# Patient Record
Sex: Female | Born: 1955
Health system: Southern US, Community
[De-identification: ages and names within clinical notes are randomized; demographics above are authoritative.]

## PROBLEM LIST (undated history)

## (undated) DIAGNOSIS — M1990 Unspecified osteoarthritis, unspecified site: Secondary | ICD-10-CM

## (undated) DIAGNOSIS — Z862 Personal history of diseases of the blood and blood-forming organs and certain disorders involving the immune mechanism: Secondary | ICD-10-CM

## (undated) DIAGNOSIS — Z9889 Other specified postprocedural states: Secondary | ICD-10-CM

## (undated) DIAGNOSIS — B019 Varicella without complication: Secondary | ICD-10-CM

## (undated) DIAGNOSIS — R112 Nausea with vomiting, unspecified: Secondary | ICD-10-CM

## (undated) DIAGNOSIS — Z9189 Other specified personal risk factors, not elsewhere classified: Secondary | ICD-10-CM

## (undated) DIAGNOSIS — C4491 Basal cell carcinoma of skin, unspecified: Secondary | ICD-10-CM

## (undated) HISTORY — DX: Other specified personal risk factors, not elsewhere classified: Z91.89

## (undated) HISTORY — PX: BREAST BIOPSY: SHX20

## (undated) HISTORY — DX: Varicella without complication: B01.9

## (undated) HISTORY — PX: THYROIDECTOMY: SHX17

## (undated) HISTORY — DX: Basal cell carcinoma of skin, unspecified: C44.91

---

## 2012-04-06 HISTORY — PX: COLONOSCOPY: SHX174

## 2014-12-20 ENCOUNTER — Encounter (INDEPENDENT_AMBULATORY_CARE_PROVIDER_SITE_OTHER): Payer: Self-pay

## 2014-12-20 ENCOUNTER — Encounter: Payer: Self-pay | Admitting: Family Medicine

## 2014-12-20 ENCOUNTER — Ambulatory Visit (INDEPENDENT_AMBULATORY_CARE_PROVIDER_SITE_OTHER): Payer: BLUE CROSS/BLUE SHIELD | Admitting: Family Medicine

## 2014-12-20 ENCOUNTER — Encounter: Payer: Self-pay | Admitting: *Deleted

## 2014-12-20 ENCOUNTER — Other Ambulatory Visit (HOSPITAL_COMMUNITY)
Admission: RE | Admit: 2014-12-20 | Discharge: 2014-12-20 | Disposition: A | Discharge status: Home / Self Care | Payer: BLUE CROSS/BLUE SHIELD | Source: Ambulatory Visit | Attending: Family Medicine | Admitting: Family Medicine

## 2014-12-20 VITALS — BP 112/72 | HR 56 | Temp 97.8°F | Ht 64.0 in | Wt 141.0 lb

## 2014-12-20 DIAGNOSIS — Z01419 Encounter for gynecological examination (general) (routine) without abnormal findings: Secondary | ICD-10-CM | POA: Diagnosis present

## 2014-12-20 DIAGNOSIS — Z0000 Encounter for general adult medical examination without abnormal findings: Secondary | ICD-10-CM | POA: Diagnosis not present

## 2014-12-20 DIAGNOSIS — Z1239 Encounter for other screening for malignant neoplasm of breast: Secondary | ICD-10-CM

## 2014-12-20 DIAGNOSIS — Z1151 Encounter for screening for human papillomavirus (HPV): Secondary | ICD-10-CM | POA: Insufficient documentation

## 2014-12-20 LAB — CBC WITH DIFFERENTIAL/PLATELET
BASOS PCT: 1.1 % (ref 0.0–3.0)
Basophils Absolute: 0 10*3/uL (ref 0.0–0.1)
EOS PCT: 2.4 % (ref 0.0–5.0)
Eosinophils Absolute: 0.1 10*3/uL (ref 0.0–0.7)
HCT: 40.9 % (ref 36.0–46.0)
Hemoglobin: 13.7 g/dL (ref 12.0–15.0)
LYMPHS ABS: 1.4 10*3/uL (ref 0.7–4.0)
Lymphocytes Relative: 32.9 % (ref 12.0–46.0)
MCHC: 33.4 g/dL (ref 30.0–36.0)
MCV: 93.5 fl (ref 78.0–100.0)
MONO ABS: 0.3 10*3/uL (ref 0.1–1.0)
Monocytes Relative: 6.2 % (ref 3.0–12.0)
NEUTROS ABS: 2.5 10*3/uL (ref 1.4–7.7)
NEUTROS PCT: 57.4 % (ref 43.0–77.0)
Platelets: 225 10*3/uL (ref 150.0–400.0)
RBC: 4.37 Mil/uL (ref 3.87–5.11)
RDW: 13 % (ref 11.5–15.5)
WBC: 4.4 10*3/uL (ref 4.0–10.5)

## 2014-12-20 LAB — LIPID PANEL
CHOLESTEROL: 177 mg/dL (ref 0–200)
HDL: 72.2 mg/dL (ref >39.00)
LDL CALC: 81 mg/dL (ref 0–99)
NonHDL: 104.35
Total CHOL/HDL Ratio: 2
Triglycerides: 115 mg/dL (ref 0.0–149.0)
VLDL: 23 mg/dL (ref 0.0–40.0)

## 2014-12-20 LAB — COMPREHENSIVE METABOLIC PANEL
ALT: 26 U/L (ref 0–35)
AST: 22 U/L (ref 0–37)
Albumin: 4.1 g/dL (ref 3.5–5.2)
Alkaline Phosphatase: 88 U/L (ref 39–117)
BUN: 13 mg/dL (ref 6–23)
CHLORIDE: 105 meq/L (ref 96–112)
CO2: 31 mEq/L (ref 19–32)
Calcium: 9.5 mg/dL (ref 8.4–10.5)
Creatinine, Ser: 0.64 mg/dL (ref 0.40–1.20)
GFR: 101.44 mL/min (ref >60.00)
GLUCOSE: 85 mg/dL (ref 70–99)
POTASSIUM: 3.9 meq/L (ref 3.5–5.1)
SODIUM: 140 meq/L (ref 135–145)
Total Bilirubin: 0.4 mg/dL (ref 0.2–1.2)
Total Protein: 6.6 g/dL (ref 6.0–8.3)

## 2014-12-20 LAB — TSH: TSH: 0.73 u[IU]/mL (ref 0.35–4.50)

## 2014-12-20 LAB — VITAMIN B12: Vitamin B-12: 662 pg/mL (ref 211–911)

## 2014-12-20 LAB — VITAMIN D 25 HYDROXY (VIT D DEFICIENCY, FRACTURES): VITD: 31.72 ng/mL (ref 30.00–100.00)

## 2014-12-20 NOTE — Progress Notes (Signed)
Pre visit review using our clinic review tool, if applicable. No additional management support is needed unless otherwise documented below in the visit note. 

## 2014-12-20 NOTE — Assessment & Plan Note (Signed)
Reviewed preventive care protocols, scheduled due services, and updated immunizations Discussed nutrition, exercise, diet, and healthy lifestyle.  Orders Placed This Encounter  Procedures  . MM Digital Screening  . CBC with Differential/Platelet  . Comprehensive metabolic panel  . Lipid panel  . TSH  . Vitamin D, 25-hydroxy  . Vitamin B12   Pap smear today. Mammogram ordered- pt to call to set up.

## 2014-12-20 NOTE — Progress Notes (Signed)
Subjective:   Patient ID: Alicia Valencia, female    DOB: 07-Dec-1955, 59 y.o.   MRN: [id number]  Alicia Valencia is a pleasant 59 y.o. year old female who presents to clinic today with [place]  on 12/20/2014  HPI: New patient, moved here from [place] to be closer to family.  Last pap smear 09/2008- no history of abnormal pap smear or post menopausal bleeding. No family history of breast, uterine cancer.  Colonoscopy 04/2012.  No current outpatient prescriptions on file prior to visit.   No current facility-administered medications on file prior to visit.    No Known Allergies  Past Medical History  Diagnosis Date  . History of fainting spells of unknown cause     Past Surgical History  Procedure Laterality Date  . Thyroidectomy    . Cesarean section      Family History  Problem Relation Age of Onset  . Heart disease Mother   . Hypertension Mother   . Diabetes Mother   . Diabetes Father   . Hypertension Sister   . Diabetes Maternal Aunt   . Diabetes Maternal Uncle   . Diabetes Maternal Grandmother   . Diabetes Maternal Grandfather     Social History   Social History  . Marital Status: Married    Spouse Name: N/A  . Number of Children: N/A  . Years of Education: N/A   Occupational History  . Not on file.   Social History Main Topics  . Smoking status: Former Research scientist (life sciences)  . Smokeless tobacco: Never Used  . Alcohol Use: Yes  . Drug Use: No  . Sexual Activity: Yes   Other Topics Concern  . Not on file   Social History Narrative  . No narrative on file   The PMH, PSH, Social History, Family History, Medications, and allergies have been reviewed in [facility], and have been updated if relevant.   Review of Systems  Constitutional: Negative.   HENT: Negative.   Eyes: Negative.   Respiratory: Negative.   Cardiovascular: Negative.   Gastrointestinal: Negative.   Endocrine: Negative.   Genitourinary: Negative.   Musculoskeletal: Negative.   Skin:  Negative.   Allergic/Immunologic: Negative.   Neurological: Negative.   Hematological: Negative.   Psychiatric/Behavioral: Negative.   All other systems reviewed and are negative.      Objective:    BP 112/72 mmHg  Pulse 56  Temp(Src) 97.8 F (36.6 C) (Oral)  Ht 5\' 4"  (1.626 m)  Wt 141 lb (63.957 kg)  BMI 24.19 kg/m2  SpO2 98%   Physical Exam    General:  Well-developed,well-nourished,in no acute distress; alert,appropriate and cooperative throughout examination Head:  normocephalic and atraumatic.   Eyes:  vision grossly intact, pupils equal, pupils round, and pupils reactive to light.   Ears:  R ear normal and L ear normal.   Nose:  no external deformity.   Mouth:  good dentition.   Neck:  No deformities, masses, or tenderness noted. Breasts:  No mass, nodules, thickening, tenderness, bulging, retraction, inflamation, nipple discharge or skin changes noted.   Lungs:  Normal respiratory effort, chest expands symmetrically. Lungs are clear to auscultation, no crackles or wheezes. Heart:  Normal rate and regular rhythm. S1 and S2 normal without gallop, murmur, click, rub or other extra sounds. Abdomen:  Bowel sounds positive,abdomen soft and non-tender without masses, organomegaly or hernias noted. Rectal:  no external abnormalities.   Genitalia:  Pelvic Exam:        External: normal female genitalia without  lesions or masses        Vagina: normal without lesions or masses        Cervix: normal without lesions or masses        Adnexa: normal bimanual exam without masses or fullness        Uterus: normal by palpation        Pap smear: performed Msk:  No deformity or scoliosis noted of thoracic or lumbar spine.   Extremities:  No clubbing, cyanosis, edema, or deformity noted with normal full range of motion of all joints.   Neurologic:  alert & oriented X3 and gait normal.   Skin:  Intact without suspicious lesions or rashes Cervical Nodes:  No lymphadenopathy  noted Axillary Nodes:  No palpable lymphadenopathy Psych:  Cognition and judgment appear intact. Alert and cooperative with normal attention span and concentration. No apparent delusions, illusions, hallucinations      Assessment & Plan:   Screening for breast cancer - Plan: MM Digital Screening, CBC with Differential/Platelet, Comprehensive metabolic panel, Lipid panel, TSH, Vitamin D, 25-hydroxy, Vitamin B12  Well woman exam No Follow-up on file.

## 2014-12-20 NOTE — Addendum Note (Signed)
Addended by: Tammi Sou on: 12/20/2014 11:34 AM   Modules accepted: Orders

## 2014-12-20 NOTE — Patient Instructions (Signed)
It was very nice to meet you. We will call you with your results and you can view them online.

## 2014-12-21 LAB — CYTOLOGY - PAP

## 2014-12-25 ENCOUNTER — Ambulatory Visit
Admission: RE | Admit: 2014-12-25 | Discharge: 2014-12-25 | Disposition: A | Discharge status: Home / Self Care | Payer: BLUE CROSS/BLUE SHIELD | Source: Ambulatory Visit | Attending: Family Medicine | Admitting: Family Medicine

## 2014-12-25 ENCOUNTER — Encounter: Payer: Self-pay | Admitting: *Deleted

## 2014-12-25 DIAGNOSIS — R922 Inconclusive mammogram: Secondary | ICD-10-CM | POA: Diagnosis not present

## 2014-12-25 DIAGNOSIS — Z1231 Encounter for screening mammogram for malignant neoplasm of breast: Secondary | ICD-10-CM | POA: Insufficient documentation

## 2014-12-25 DIAGNOSIS — Z1239 Encounter for other screening for malignant neoplasm of breast: Secondary | ICD-10-CM

## 2015-01-08 ENCOUNTER — Encounter: Payer: BLUE CROSS/BLUE SHIELD | Admitting: Family Medicine

## 2015-01-10 ENCOUNTER — Other Ambulatory Visit: Payer: Self-pay | Admitting: Family Medicine

## 2015-01-10 DIAGNOSIS — R928 Other abnormal and inconclusive findings on diagnostic imaging of breast: Secondary | ICD-10-CM

## 2015-01-23 ENCOUNTER — Ambulatory Visit
Admission: RE | Admit: 2015-01-23 | Discharge: 2015-01-23 | Disposition: A | Discharge status: Home / Self Care | Payer: BLUE CROSS/BLUE SHIELD | Source: Ambulatory Visit | Attending: Family Medicine | Admitting: Family Medicine

## 2015-01-23 DIAGNOSIS — R928 Other abnormal and inconclusive findings on diagnostic imaging of breast: Secondary | ICD-10-CM

## 2015-12-23 ENCOUNTER — Other Ambulatory Visit: Payer: Self-pay | Admitting: Family Medicine

## 2015-12-23 ENCOUNTER — Ambulatory Visit (INDEPENDENT_AMBULATORY_CARE_PROVIDER_SITE_OTHER): Payer: BLUE CROSS/BLUE SHIELD | Admitting: Family Medicine

## 2015-12-23 ENCOUNTER — Encounter: Payer: Self-pay | Admitting: Family Medicine

## 2015-12-23 VITALS — BP 112/60 | HR 52 | Temp 97.9°F | Ht 64.0 in | Wt 138.0 lb

## 2015-12-23 DIAGNOSIS — Z1231 Encounter for screening mammogram for malignant neoplasm of breast: Secondary | ICD-10-CM

## 2015-12-23 DIAGNOSIS — Z01419 Encounter for gynecological examination (general) (routine) without abnormal findings: Secondary | ICD-10-CM

## 2015-12-23 DIAGNOSIS — Z0000 Encounter for general adult medical examination without abnormal findings: Secondary | ICD-10-CM | POA: Diagnosis not present

## 2015-12-23 LAB — CBC WITH DIFFERENTIAL/PLATELET
BASOS PCT: 0.9 % (ref 0.0–3.0)
Basophils Absolute: 0 10*3/uL (ref 0.0–0.1)
EOS PCT: 2.9 % (ref 0.0–5.0)
Eosinophils Absolute: 0.1 10*3/uL (ref 0.0–0.7)
HCT: 41.8 % (ref 36.0–46.0)
HEMOGLOBIN: 14.1 g/dL (ref 12.0–15.0)
LYMPHS ABS: 1.6 10*3/uL (ref 0.7–4.0)
Lymphocytes Relative: 40 % (ref 12.0–46.0)
MCHC: 33.9 g/dL (ref 30.0–36.0)
MCV: 92.5 fl (ref 78.0–100.0)
MONO ABS: 0.4 10*3/uL (ref 0.1–1.0)
Monocytes Relative: 9.3 % (ref 3.0–12.0)
NEUTROS ABS: 1.9 10*3/uL (ref 1.4–7.7)
Neutrophils Relative %: 46.9 % (ref 43.0–77.0)
PLATELETS: 242 10*3/uL (ref 150.0–400.0)
RBC: 4.52 Mil/uL (ref 3.87–5.11)
RDW: 13 % (ref 11.5–15.5)
WBC: 4.1 10*3/uL (ref 4.0–10.5)

## 2015-12-23 LAB — LIPID PANEL
CHOL/HDL RATIO: 2
Cholesterol: 196 mg/dL (ref 0–200)
HDL: 79.1 mg/dL (ref >39.00)
LDL Cholesterol: 102 mg/dL — ABNORMAL HIGH (ref 0–99)
NONHDL: 116.66
TRIGLYCERIDES: 71 mg/dL (ref 0.0–149.0)
VLDL: 14.2 mg/dL (ref 0.0–40.0)

## 2015-12-23 LAB — COMPREHENSIVE METABOLIC PANEL
ALK PHOS: 77 U/L (ref 39–117)
ALT: 20 U/L (ref 0–35)
AST: 19 U/L (ref 0–37)
Albumin: 4.3 g/dL (ref 3.5–5.2)
BILIRUBIN TOTAL: 0.7 mg/dL (ref 0.2–1.2)
BUN: 13 mg/dL (ref 6–23)
CALCIUM: 9.3 mg/dL (ref 8.4–10.5)
CO2: 30 meq/L (ref 19–32)
CREATININE: 0.83 mg/dL (ref 0.40–1.20)
Chloride: 104 mEq/L (ref 96–112)
GFR: 74.88 mL/min (ref >60.00)
GLUCOSE: 96 mg/dL (ref 70–99)
Potassium: 3.8 mEq/L (ref 3.5–5.1)
SODIUM: 140 meq/L (ref 135–145)
TOTAL PROTEIN: 6.8 g/dL (ref 6.0–8.3)

## 2015-12-23 LAB — TSH: TSH: 1.48 u[IU]/mL (ref 0.35–4.50)

## 2015-12-23 NOTE — Progress Notes (Signed)
Pre visit review using our clinic review tool, if applicable. No additional management support is needed unless otherwise documented below in the visit note. 

## 2015-12-23 NOTE — Assessment & Plan Note (Signed)
Reviewed preventive care protocols, scheduled due services, and updated immunizations Discussed nutrition, exercise, diet, and healthy lifestyle.  Declines influenza vaccine.  Orders Placed This Encounter  Procedures  . CBC with Differential/Platelet  . Comprehensive metabolic panel  . Lipid panel  . TSH

## 2015-12-23 NOTE — Patient Instructions (Signed)
Great to see you. We will call you with your lab results and you can them online.

## 2015-12-23 NOTE — Progress Notes (Signed)
Subjective:   Patient ID: Sanvi Oguinn, female    DOB: 13-Feb-1956, 60 y.o.   MRN: [id number]  Chani Karnatz is a pleasant 60 y.o. year old female who presents to clinic today with Annual Exam  on 12/23/2015  HPI: Established care with me last year.   Last pap smear 12/2014- no history of abnormal pap smear or post menopausal bleeding. No family history of breast, uterine cancer.  Colonoscopy 04/2012.  Mammogram 01/23/15  Lab Results  Component Value Date   CHOL 177 12/20/2014   HDL 72.20 12/20/2014   LDLCALC 81 12/20/2014   TRIG 115.0 12/20/2014   CHOLHDL 2 12/20/2014   Lab Results  Component Value Date   CREATININE 0.64 12/20/2014   Lab Results  Component Value Date   TSH 0.73 12/20/2014   Lab Results  Component Value Date   WBC 4.4 12/20/2014   HGB 13.7 12/20/2014   HCT 40.9 12/20/2014   MCV 93.5 12/20/2014   PLT 225.0 12/20/2014   Lab Results  Component Value Date   NA 140 12/20/2014   K 3.9 12/20/2014   CL 105 12/20/2014   CO2 31 12/20/2014   Lab Results  Component Value Date   ALT 26 12/20/2014   AST 22 12/20/2014   ALKPHOS 88 12/20/2014   BILITOT 0.4 12/20/2014     No current outpatient prescriptions on file prior to visit.   No current facility-administered medications on file prior to visit.     No Known Allergies  Past Medical History:  Diagnosis Date  . History of fainting spells of unknown cause     Past Surgical History:  Procedure Laterality Date  . BREAST BIOPSY Left    negative  . CESAREAN SECTION    . THYROIDECTOMY      Family History  Problem Relation Age of Onset  . Heart disease Mother   . Hypertension Mother   . Diabetes Mother   . Diabetes Father   . Hypertension Sister   . Diabetes Maternal Aunt   . Diabetes Maternal Uncle   . Diabetes Maternal Grandmother   . Diabetes Maternal Grandfather     Social History   Social History  . Marital status: Married    Spouse name: N/A  . Number of children: N/A    . Years of education: N/A   Occupational History  . Not on file.   Social History Main Topics  . Smoking status: Former Research scientist (life sciences)  . Smokeless tobacco: Never Used  . Alcohol use Yes  . Drug use: No  . Sexual activity: Yes   Other Topics Concern  . Not on file   Social History Narrative  . No narrative on file   The PMH, PSH, Social History, Family History, Medications, and allergies have been reviewed in [facility], and have been updated if relevant.   Review of Systems  Constitutional: Negative.   HENT: Negative.   Eyes: Negative.   Respiratory: Negative.   Cardiovascular: Negative.   Gastrointestinal: Negative.   Endocrine: Negative.   Genitourinary: Negative.   Musculoskeletal: Negative.   Skin: Negative.   Allergic/Immunologic: Negative.   Neurological: Negative.   Hematological: Negative.   Psychiatric/Behavioral: Negative.   All other systems reviewed and are negative.      Objective:    BP 112/60   Pulse (!) 52   Temp 97.9 F (36.6 C) (Oral)   Ht 5\' 4"  (1.626 m)   Wt 138 lb (62.6 kg)   SpO2 99%   BMI 23.69  kg/m    Physical Exam    General:  Well-developed,well-nourished,in no acute distress; alert,appropriate and cooperative throughout examination Head:  normocephalic and atraumatic.   Eyes:  vision grossly intact, pupils equal, pupils round, and pupils reactive to light.   Ears:  R ear normal and L ear normal.   Nose:  no external deformity.   Mouth:  good dentition.   Neck:  No deformities, masses, or tenderness noted. Breasts:  No mass, nodules, thickening, tenderness, bulging, retraction, inflamation, nipple discharge or skin changes noted.   Lungs:  Normal respiratory effort, chest expands symmetrically. Lungs are clear to auscultation, no crackles or wheezes. Heart:  Normal rate and regular rhythm. S1 and S2 normal without gallop, murmur, click, rub or other extra sounds. Abdomen:  Bowel sounds positive,abdomen soft and non-tender without  masses, organomegaly or hernias noted. Msk:  No deformity or scoliosis noted of thoracic or lumbar spine.   Extremities:  No clubbing, cyanosis, edema, or deformity noted with normal full range of motion of all joints.   Neurologic:  alert & oriented X3 and gait normal.   Skin:  Intact without suspicious lesions or rashes Cervical Nodes:  No lymphadenopathy noted Axillary Nodes:  No palpable lymphadenopathy Psych:  Cognition and judgment appear intact. Alert and cooperative with normal attention span and concentration. No apparent delusions, illusions, hallucinations      Assessment & Plan:   Well woman exam - Plan: CBC with Differential/Platelet, Comprehensive metabolic panel, Lipid panel, TSH No Follow-up on file.

## 2015-12-24 ENCOUNTER — Encounter: Payer: Self-pay | Admitting: Family Medicine

## 2016-01-08 ENCOUNTER — Other Ambulatory Visit: Payer: Self-pay | Admitting: Family Medicine

## 2016-01-08 ENCOUNTER — Ambulatory Visit
Admission: RE | Admit: 2016-01-08 | Discharge: 2016-01-08 | Disposition: A | Discharge status: Home / Self Care | Payer: BLUE CROSS/BLUE SHIELD | Source: Ambulatory Visit | Attending: Family Medicine | Admitting: Family Medicine

## 2016-01-08 DIAGNOSIS — Z1231 Encounter for screening mammogram for malignant neoplasm of breast: Secondary | ICD-10-CM | POA: Insufficient documentation

## 2016-10-26 ENCOUNTER — Telehealth: Payer: Self-pay | Admitting: Family Medicine

## 2016-10-26 DIAGNOSIS — Z780 Asymptomatic menopausal state: Secondary | ICD-10-CM

## 2016-10-26 NOTE — Telephone Encounter (Signed)
Pt is calling abut a bone density test.  She would like to have it done before her 09/19 cpe with you.  Her last test was 5 years ago, done in [place].  cb number is [phone]

## 2016-10-26 NOTE — Telephone Encounter (Signed)
Order entered

## 2016-10-27 NOTE — Telephone Encounter (Signed)
Appt made and patient aware.  

## 2016-10-29 ENCOUNTER — Ambulatory Visit
Admission: RE | Admit: 2016-10-29 | Discharge: 2016-10-29 | Disposition: A | Discharge status: Home / Self Care | Payer: BLUE CROSS/BLUE SHIELD | Source: Ambulatory Visit | Attending: Family Medicine | Admitting: Family Medicine

## 2016-10-29 DIAGNOSIS — M8588 Other specified disorders of bone density and structure, other site: Secondary | ICD-10-CM | POA: Diagnosis not present

## 2016-10-29 DIAGNOSIS — Z780 Asymptomatic menopausal state: Secondary | ICD-10-CM

## 2016-11-12 ENCOUNTER — Other Ambulatory Visit: Payer: BLUE CROSS/BLUE SHIELD

## 2016-12-11 ENCOUNTER — Other Ambulatory Visit: Payer: Self-pay | Admitting: Family Medicine

## 2016-12-11 DIAGNOSIS — Z01419 Encounter for gynecological examination (general) (routine) without abnormal findings: Secondary | ICD-10-CM

## 2016-12-16 ENCOUNTER — Other Ambulatory Visit (INDEPENDENT_AMBULATORY_CARE_PROVIDER_SITE_OTHER): Payer: BLUE CROSS/BLUE SHIELD

## 2016-12-16 DIAGNOSIS — Z01419 Encounter for gynecological examination (general) (routine) without abnormal findings: Secondary | ICD-10-CM | POA: Diagnosis not present

## 2016-12-16 LAB — LIPID PANEL
CHOLESTEROL: 197 mg/dL (ref 0–200)
HDL: 96.6 mg/dL (ref >39.00)
LDL Cholesterol: 86 mg/dL (ref 0–99)
NonHDL: 100.59
TRIGLYCERIDES: 74 mg/dL (ref 0.0–149.0)
Total CHOL/HDL Ratio: 2
VLDL: 14.8 mg/dL (ref 0.0–40.0)

## 2016-12-16 LAB — CBC WITH DIFFERENTIAL/PLATELET
BASOS PCT: 1.4 % (ref 0.0–3.0)
Basophils Absolute: 0 10*3/uL (ref 0.0–0.1)
EOS PCT: 3.7 % (ref 0.0–5.0)
Eosinophils Absolute: 0.1 10*3/uL (ref 0.0–0.7)
HCT: 43.8 % (ref 36.0–46.0)
HEMOGLOBIN: 14.8 g/dL (ref 12.0–15.0)
Lymphocytes Relative: 40.4 % (ref 12.0–46.0)
Lymphs Abs: 1.4 10*3/uL (ref 0.7–4.0)
MCHC: 33.9 g/dL (ref 30.0–36.0)
MCV: 94.1 fl (ref 78.0–100.0)
MONOS PCT: 9 % (ref 3.0–12.0)
Monocytes Absolute: 0.3 10*3/uL (ref 0.1–1.0)
Neutro Abs: 1.6 10*3/uL (ref 1.4–7.7)
Neutrophils Relative %: 45.5 % (ref 43.0–77.0)
Platelets: 245 10*3/uL (ref 150.0–400.0)
RBC: 4.66 Mil/uL (ref 3.87–5.11)
RDW: 12.9 % (ref 11.5–15.5)
WBC: 3.4 10*3/uL — AB (ref 4.0–10.5)

## 2016-12-16 LAB — COMPREHENSIVE METABOLIC PANEL
ALBUMIN: 4.6 g/dL (ref 3.5–5.2)
ALK PHOS: 76 U/L (ref 39–117)
ALT: 22 U/L (ref 0–35)
AST: 19 U/L (ref 0–37)
BUN: 18 mg/dL (ref 6–23)
CALCIUM: 10 mg/dL (ref 8.4–10.5)
CHLORIDE: 103 meq/L (ref 96–112)
CO2: 28 mEq/L (ref 19–32)
Creatinine, Ser: 0.82 mg/dL (ref 0.40–1.20)
GFR: 75.68 mL/min (ref >60.00)
Glucose, Bld: 92 mg/dL (ref 70–99)
POTASSIUM: 3.9 meq/L (ref 3.5–5.1)
Sodium: 140 mEq/L (ref 135–145)
TOTAL PROTEIN: 6.9 g/dL (ref 6.0–8.3)
Total Bilirubin: 0.6 mg/dL (ref 0.2–1.2)

## 2016-12-16 LAB — TSH: TSH: 1.19 u[IU]/mL (ref 0.35–4.50)

## 2016-12-18 ENCOUNTER — Other Ambulatory Visit: Payer: BLUE CROSS/BLUE SHIELD

## 2016-12-23 ENCOUNTER — Encounter: Payer: Self-pay | Admitting: Family Medicine

## 2016-12-23 ENCOUNTER — Ambulatory Visit (INDEPENDENT_AMBULATORY_CARE_PROVIDER_SITE_OTHER): Payer: BLUE CROSS/BLUE SHIELD | Admitting: Family Medicine

## 2016-12-23 VITALS — BP 118/82 | HR 61 | Temp 97.7°F | Ht 64.0 in | Wt 141.8 lb

## 2016-12-23 DIAGNOSIS — Z01419 Encounter for gynecological examination (general) (routine) without abnormal findings: Secondary | ICD-10-CM

## 2016-12-23 DIAGNOSIS — Z23 Encounter for immunization: Secondary | ICD-10-CM

## 2016-12-23 DIAGNOSIS — M810 Age-related osteoporosis without current pathological fracture: Secondary | ICD-10-CM | POA: Insufficient documentation

## 2016-12-23 DIAGNOSIS — M8580 Other specified disorders of bone density and structure, unspecified site: Secondary | ICD-10-CM

## 2016-12-23 NOTE — Assessment & Plan Note (Signed)
Discussed increased dietary intake and continued weight bearing exercises.

## 2016-12-23 NOTE — Patient Instructions (Signed)
Calcium supplements have received some bad press lately, with questions that they may increase risk of heart attack or blood clots.  The risk is very low, however none of these risks occur with calcium in FOOD. Try to get most or all of your calcium from your food--aim for 1000 mg/day for women up to 38 and men up to 70 and 1200 mg/day for women over 56 and men over 70.  To figure out dietary calcium: 300 mg/day from all non dairy foods plus 300 mg per cup of milk, other dairy, or fortified juice.  Non dairy foods that contain calcium:  Kale, oranges, sardines, oatmeal, soy milk/soybeans, salmon, white beans, dried figs, turnip greens, almonds, broccoli, tofu.

## 2016-12-23 NOTE — Addendum Note (Signed)
Addended by: Benson Setting L on: 12/23/2016 11:50 AM   Modules accepted: Orders

## 2016-12-23 NOTE — Progress Notes (Signed)
Subjective:   Patient ID: Alicia Valencia, female    DOB: February 24, 1956, 61 y.o.   MRN: [id number]  Alicia Valencia is a pleasant 61 y.o. year old female who presents to clinic today with Annual Exam (Pt wants to discuss her bone density )  on 12/23/2016  HPI:  Last pap smear 12/2014- no history of abnormal pap smear or post menopausal bleeding. No family history of breast, uterine cancer.  Colonoscopy 04/2012.  Mammogram 10/31/16  Osteopenia-  Your patient Alicia Valencia completed a BMD test on 10/29/2016 using the [place] (analysis version: 14.10) manufactured by [employer]. The following summarizes the results of our evaluation.  PATIENT BIOGRAPHICAL: Name: Alicia Valencia, Alicia Valencia Patient ID: [id number] Birth Date: 1956-03-06 Height: 64.0 in. Gender: Female Exam Date: 10/29/2016 Weight: 138.0 lbs. Indications: Caucasian, Postmenopausal Fractures: Treatments: Calcium, Multi-Vitamin with calcium  ASSESSMENT:  The BMD measured at AP Spine L1-L4 is 0.938 g/cm2 with a T-score of -2.1. This patient is considered osteopenic according to [facility]) criteria.  Site Region Measured Measured WHO Young Adult BMD Date       Age      Classification T-score AP Spine L1-L4 10/29/2016 61 Osteopenia -2.1 0.938 g/cm2  DualFemur Neck Left 10/29/2016 61 Osteopenia -1.6 0.817 g/cm2  World Health Organization [facility]) criteria for post-menopausal, Caucasian Women: Normal:       T-score at or above -1 SD Osteopenia:   T-score between -1 and -2.5 SD Osteoporosis: T-score at or below -2.5 SD  RECOMMENDATIONS: [place] recommends that FDA-approved medical therapies be considered in postmenopausal women and men age 100 or older with a: 1. Hip or vertebral (clinical or morphometric) fracture. 2. T-score of < -2.5 at the spine or hip. 3. Ten-year fracture probability by FRAX of 3% or greater for hip fracture or 20% or greater for major  osteoporotic fracture.  All treatment decisions require clinical judgment and consideration of individual patient factors, including patient preferences, co-morbidities, previous drug use, risk factors not captured in the FRAX model (e.g. falls, vitamin D deficiency, increased bone turnover, interval significant decline in bone density) and possible under - or over-estimation of fracture risk by FRAX.  All patients should ensure an adequate intake of dietary calcium (1200 mg/d) and vitamin D (800 IU daily) unless contraindicated. FOLLOW-UP: People with diagnosed cases of osteoporosis or at high risk for fracture should have regular bone mineral density tests. For patients eligible for Medicare, routine testing is allowed once every 2 years. The testing frequency can be increased to one year for patients who have rapidly progressing disease, those who are receiving or discontinuing medical therapy to restore bone mass, or have additional risk factors.  I have reviewed this report, and agree with the above findings.  [facility]   Electronically Signed   By: Nolon Nations M.D.  Lab Results  Component Value Date   CHOL 197 12/16/2016   HDL 96.60 12/16/2016   LDLCALC 86 12/16/2016   TRIG 74.0 12/16/2016   CHOLHDL 2 12/16/2016   Lab Results  Component Value Date   CREATININE 0.82 12/16/2016   Lab Results  Component Value Date   TSH 1.19 12/16/2016   Lab Results  Component Value Date   WBC 3.4 (L) 12/16/2016   HGB 14.8 12/16/2016   HCT 43.8 12/16/2016   MCV 94.1 12/16/2016   PLT 245.0 12/16/2016   Lab Results  Component Value Date   NA 140 12/16/2016   K 3.9 12/16/2016   CL  103 12/16/2016   CO2 28 12/16/2016   Lab Results  Component Value Date   ALT 22 12/16/2016   AST 19 12/16/2016   ALKPHOS 76 12/16/2016   BILITOT 0.6 12/16/2016     No current outpatient prescriptions on file prior to visit.   No current facility-administered  medications on file prior to visit.     No Known Allergies  Past Medical History:  Diagnosis Date  . History of fainting spells of unknown cause     Past Surgical History:  Procedure Laterality Date  . BREAST BIOPSY Left    negative  . CESAREAN SECTION    . THYROIDECTOMY      Family History  Problem Relation Age of Onset  . Heart disease Mother   . Hypertension Mother   . Diabetes Mother   . Diabetes Father   . Hypertension Sister   . Diabetes Maternal Aunt   . Diabetes Maternal Uncle   . Diabetes Maternal Grandmother   . Diabetes Maternal Grandfather   . Breast cancer Neg Hx     Social History   Social History  . Marital status: Married    Spouse name: N/A  . Number of children: N/A  . Years of education: N/A   Occupational History  . Not on file.   Social History Main Topics  . Smoking status: Former Research scientist (life sciences)  . Smokeless tobacco: Never Used  . Alcohol use Yes  . Drug use: No  . Sexual activity: Yes   Other Topics Concern  . Not on file   Social History Narrative  . No narrative on file   The PMH, PSH, Social History, Family History, Medications, and allergies have been reviewed in [facility], and have been updated if relevant.   Review of Systems  Constitutional: Negative.   HENT: Negative.   Eyes: Negative.   Respiratory: Negative.   Cardiovascular: Negative.   Gastrointestinal: Negative.   Endocrine: Negative.   Genitourinary: Negative.   Musculoskeletal: Negative.   Skin: Negative.   Allergic/Immunologic: Negative.   Neurological: Negative.   Hematological: Negative.   Psychiatric/Behavioral: Negative.   All other systems reviewed and are negative.      Objective:    BP 118/82 (BP Location: Right Arm, Patient Position: Sitting)   Pulse 61   Temp 97.7 F (36.5 C) (Oral)   Ht 5\' 4"  (1.626 m)   Wt 141 lb 12.8 oz (64.3 kg)   SpO2 97%   BMI 24.34 kg/m    Physical Exam    General:  Well-developed,well-nourished,in no acute  distress; alert,appropriate and cooperative throughout examination Head:  normocephalic and atraumatic.   Eyes:  vision grossly intact, pupils equal, pupils round, and pupils reactive to light.   Ears:  R ear normal and L ear normal.   Nose:  no external deformity.   Mouth:  good dentition.   Neck:  No deformities, masses, or tenderness noted. Breasts:  No mass, nodules, thickening, tenderness, bulging, retraction, inflamation, nipple discharge or skin changes noted.   Lungs:  Normal respiratory effort, chest expands symmetrically. Lungs are clear to auscultation, no crackles or wheezes. Heart:  Normal rate and regular rhythm. S1 and S2 normal without gallop, murmur, click, rub or other extra sounds. Abdomen:  Bowel sounds positive,abdomen soft and non-tender without masses, organomegaly or hernias noted. Msk:  No deformity or scoliosis noted of thoracic or lumbar spine.   Extremities:  No clubbing, cyanosis, edema, or deformity noted with normal full range of motion of all joints.  Neurologic:  alert & oriented X3 and gait normal.   Skin:  Intact without suspicious lesions or rashes Cervical Nodes:  No lymphadenopathy noted Axillary Nodes:  No palpable lymphadenopathy Psych:  Cognition and judgment appear intact. Alert and cooperative with normal attention span and concentration. No apparent delusions, illusions, hallucinations      Assessment & Plan:   Well woman exam No Follow-up on file.

## 2016-12-23 NOTE — Assessment & Plan Note (Signed)
Reviewed preventive care protocols, scheduled due services, and updated immunizations Discussed nutrition, exercise, diet, and healthy lifestyle.  Influenza vaccine given today. 

## 2017-02-04 DIAGNOSIS — L905 Scar conditions and fibrosis of skin: Secondary | ICD-10-CM | POA: Diagnosis not present

## 2017-02-04 DIAGNOSIS — C44319 Basal cell carcinoma of skin of other parts of face: Secondary | ICD-10-CM | POA: Diagnosis not present

## 2017-02-08 ENCOUNTER — Other Ambulatory Visit: Payer: Self-pay | Admitting: Family Medicine

## 2017-02-08 DIAGNOSIS — Z1231 Encounter for screening mammogram for malignant neoplasm of breast: Secondary | ICD-10-CM

## 2017-02-23 ENCOUNTER — Encounter: Payer: Self-pay | Admitting: Family Medicine

## 2017-03-02 ENCOUNTER — Ambulatory Visit
Admission: RE | Admit: 2017-03-02 | Discharge: 2017-03-02 | Disposition: A | Discharge status: Home / Self Care | Payer: BLUE CROSS/BLUE SHIELD | Source: Ambulatory Visit | Attending: Family Medicine | Admitting: Family Medicine

## 2017-03-02 DIAGNOSIS — Z1231 Encounter for screening mammogram for malignant neoplasm of breast: Secondary | ICD-10-CM | POA: Insufficient documentation

## 2017-05-07 DIAGNOSIS — Z85828 Personal history of other malignant neoplasm of skin: Secondary | ICD-10-CM | POA: Diagnosis not present

## 2017-05-07 DIAGNOSIS — C44519 Basal cell carcinoma of skin of other part of trunk: Secondary | ICD-10-CM | POA: Diagnosis not present

## 2017-05-07 DIAGNOSIS — D485 Neoplasm of uncertain behavior of skin: Secondary | ICD-10-CM | POA: Diagnosis not present

## 2017-12-29 DIAGNOSIS — D2262 Melanocytic nevi of left upper limb, including shoulder: Secondary | ICD-10-CM | POA: Diagnosis not present

## 2017-12-29 DIAGNOSIS — D2261 Melanocytic nevi of right upper limb, including shoulder: Secondary | ICD-10-CM | POA: Diagnosis not present

## 2017-12-29 DIAGNOSIS — X32XXXA Exposure to sunlight, initial encounter: Secondary | ICD-10-CM | POA: Diagnosis not present

## 2017-12-29 DIAGNOSIS — Z85828 Personal history of other malignant neoplasm of skin: Secondary | ICD-10-CM | POA: Diagnosis not present

## 2017-12-29 DIAGNOSIS — D2272 Melanocytic nevi of left lower limb, including hip: Secondary | ICD-10-CM | POA: Diagnosis not present

## 2017-12-29 DIAGNOSIS — L570 Actinic keratosis: Secondary | ICD-10-CM | POA: Diagnosis not present

## 2018-01-16 ENCOUNTER — Encounter: Payer: Self-pay | Admitting: Family Medicine

## 2018-01-17 ENCOUNTER — Other Ambulatory Visit: Payer: Self-pay | Admitting: Family Medicine

## 2018-01-17 DIAGNOSIS — Z1231 Encounter for screening mammogram for malignant neoplasm of breast: Secondary | ICD-10-CM

## 2018-01-17 DIAGNOSIS — Z01419 Encounter for gynecological examination (general) (routine) without abnormal findings: Secondary | ICD-10-CM

## 2018-01-19 ENCOUNTER — Other Ambulatory Visit (INDEPENDENT_AMBULATORY_CARE_PROVIDER_SITE_OTHER): Payer: BLUE CROSS/BLUE SHIELD

## 2018-01-19 DIAGNOSIS — Z01419 Encounter for gynecological examination (general) (routine) without abnormal findings: Secondary | ICD-10-CM

## 2018-01-19 LAB — COMPREHENSIVE METABOLIC PANEL
ALT: 22 U/L (ref 0–35)
AST: 19 U/L (ref 0–37)
Albumin: 4.2 g/dL (ref 3.5–5.2)
Alkaline Phosphatase: 78 U/L (ref 39–117)
BUN: 14 mg/dL (ref 6–23)
CO2: 28 mEq/L (ref 19–32)
Calcium: 9.6 mg/dL (ref 8.4–10.5)
Chloride: 104 mEq/L (ref 96–112)
Creatinine, Ser: 0.82 mg/dL (ref 0.40–1.20)
GFR: 75.4 mL/min (ref >60.00)
Glucose, Bld: 108 mg/dL — ABNORMAL HIGH (ref 70–99)
Potassium: 3.8 mEq/L (ref 3.5–5.1)
Sodium: 139 mEq/L (ref 135–145)
Total Bilirubin: 0.4 mg/dL (ref 0.2–1.2)
Total Protein: 6.6 g/dL (ref 6.0–8.3)

## 2018-01-19 LAB — TSH: TSH: 0.81 u[IU]/mL (ref 0.35–4.50)

## 2018-01-19 LAB — LIPID PANEL
Cholesterol: 164 mg/dL (ref 0–200)
HDL: 70.8 mg/dL (ref >39.00)
LDL Cholesterol: 76 mg/dL (ref 0–99)
NonHDL: 93.39
Total CHOL/HDL Ratio: 2
Triglycerides: 87 mg/dL (ref 0.0–149.0)
VLDL: 17.4 mg/dL (ref 0.0–40.0)

## 2018-01-19 LAB — CBC WITH DIFFERENTIAL/PLATELET
BASOS ABS: 0 10*3/uL (ref 0.0–0.1)
Basophils Relative: 0.8 % (ref 0.0–3.0)
EOS ABS: 0.1 10*3/uL (ref 0.0–0.7)
Eosinophils Relative: 3 % (ref 0.0–5.0)
HEMATOCRIT: 37.2 % (ref 36.0–46.0)
Hemoglobin: 12.5 g/dL (ref 12.0–15.0)
LYMPHS PCT: 26.2 % (ref 12.0–46.0)
Lymphs Abs: 0.9 10*3/uL (ref 0.7–4.0)
MCHC: 33.6 g/dL (ref 30.0–36.0)
MCV: 91.7 fl (ref 78.0–100.0)
MONOS PCT: 11.4 % (ref 3.0–12.0)
Monocytes Absolute: 0.4 10*3/uL (ref 0.1–1.0)
Neutro Abs: 2 10*3/uL (ref 1.4–7.7)
Neutrophils Relative %: 58.6 % (ref 43.0–77.0)
Platelets: 218 10*3/uL (ref 150.0–400.0)
RBC: 4.06 Mil/uL (ref 3.87–5.11)
RDW: 13.3 % (ref 11.5–15.5)
WBC: 3.4 10*3/uL — AB (ref 4.0–10.5)

## 2018-01-26 ENCOUNTER — Encounter: Payer: Self-pay | Admitting: Family Medicine

## 2018-01-26 ENCOUNTER — Ambulatory Visit (INDEPENDENT_AMBULATORY_CARE_PROVIDER_SITE_OTHER): Payer: BLUE CROSS/BLUE SHIELD | Admitting: Family Medicine

## 2018-01-26 ENCOUNTER — Other Ambulatory Visit (HOSPITAL_COMMUNITY)
Admission: RE | Admit: 2018-01-26 | Discharge: 2018-01-26 | Disposition: A | Discharge status: Home / Self Care | Payer: BLUE CROSS/BLUE SHIELD | Source: Ambulatory Visit | Attending: Family Medicine | Admitting: Family Medicine

## 2018-01-26 VITALS — BP 126/84 | HR 63 | Temp 98.1°F | Ht 63.5 in | Wt 141.8 lb

## 2018-01-26 DIAGNOSIS — M8580 Other specified disorders of bone density and structure, unspecified site: Secondary | ICD-10-CM

## 2018-01-26 DIAGNOSIS — N898 Other specified noninflammatory disorders of vagina: Secondary | ICD-10-CM | POA: Diagnosis not present

## 2018-01-26 DIAGNOSIS — Z01419 Encounter for gynecological examination (general) (routine) without abnormal findings: Secondary | ICD-10-CM | POA: Diagnosis not present

## 2018-01-26 NOTE — Assessment & Plan Note (Signed)
Reviewed preventive care protocols, scheduled due services, and updated immunizations Discussed nutrition, exercise, diet, and healthy lifestyle.  Pap smear done today. 

## 2018-01-26 NOTE — Patient Instructions (Signed)
Great to see you. I will call you with your results from today and you can view them online.   

## 2018-01-26 NOTE — Progress Notes (Signed)
Subjective:   Patient ID: Alicia Valencia, female    DOB: 16-Jan-1956, 62 y.o.   MRN: [id number]  Alicia Valencia is a pleasant 62 y.o. year old female who presents to clinic today with Annual Exam (Patient is here today for a CPE with PAP. She was not fasting for labs on 10.16.2019. She declines Hep-C & HIV lab draws.  She had her Flu on 10.10.19 at [facility] and declines the Tdap today and will let us know the date she received it.  Mammogram ordered for Dec and last BMD was last year and WNL.)  on 01/26/2018  HPI:  Health Maintenance  Topic Date Due  . PAP SMEAR  12/19/2017  . TETANUS/TDAP  01/27/2019 (Originally 05/09/1976)  . MAMMOGRAM  03/03/2019  . COLONOSCOPY  07/14/2024  . INFLUENZA VACCINE  Completed  . Hepatitis C Screening  Discontinued  . HIV Screening  Discontinued     Last pap smear 12/2014- no history of abnormal pap smear or post menopausal bleeding. No family history of breast, uterine cancer.  Colonoscopy 04/2012.  Mammogram 03/02/17, has one scheduled for 03/09/18.  Osteopenia- last DEXA was done on 10/29/16- T score of -2.1.  She is taking Citracal and walking often.   Lab Results  Component Value Date   CHOL 164 01/19/2018   HDL 70.80 01/19/2018   LDLCALC 76 01/19/2018   TRIG 87.0 01/19/2018   CHOLHDL 2 01/19/2018   Lab Results  Component Value Date   CREATININE 0.82 01/19/2018   Lab Results  Component Value Date   TSH 0.81 01/19/2018   Lab Results  Component Value Date   WBC 3.4 (L) 01/19/2018   HGB 12.5 01/19/2018   HCT 37.2 01/19/2018   MCV 91.7 01/19/2018   PLT 218.0 01/19/2018   Lab Results  Component Value Date   NA 139 01/19/2018   K 3.8 01/19/2018   CL 104 01/19/2018   CO2 28 01/19/2018   Lab Results  Component Value Date   ALT 22 01/19/2018   AST 19 01/19/2018   ALKPHOS 78 01/19/2018   BILITOT 0.4 01/19/2018     Current Outpatient Medications on File Prior to Visit  Medication Sig Dispense Refill  . calcium citrate-vitamin D  (CITRACAL+D) 315-200 MG-UNIT tablet Take by mouth.    . Multiple Vitamins-Minerals (MULTIVITAMIN ADULTS 50+) TABS Take 1 tablet by mouth daily.    . Multiple Vitamins-Minerals (PRESERVISION AREDS 2 PO) Take 2 tablets by mouth daily.    . Omega-3 Fatty Acids (FISH OIL PO) Take 1,500 mg by mouth daily.     No current facility-administered medications on file prior to visit.     No Known Allergies  Past Medical History:  Diagnosis Date  . History of fainting spells of unknown cause     Past Surgical History:  Procedure Laterality Date  . BREAST BIOPSY Left    negative core  . CESAREAN SECTION    . THYROIDECTOMY      Family History  Problem Relation Age of Onset  . Heart disease Mother   . Hypertension Mother   . Diabetes Mother   . Diabetes Father   . Hypertension Sister   . Diabetes Maternal Aunt   . Diabetes Maternal Uncle   . Diabetes Maternal Grandmother   . Diabetes Maternal Grandfather   . Breast cancer Neg Hx     Social History   Socioeconomic History  . Marital status: Married    Spouse name: Not on file  . Number of children:  Not on file  . Years of education: Not on file  . Highest education level: Not on file  Occupational History  . Not on file  Social Needs  . Financial resource strain: Not on file  . Food insecurity:    Worry: Not on file    Inability: Not on file  . Transportation needs:    Medical: Not on file    Non-medical: Not on file  Tobacco Use  . Smoking status: Former Research scientist (life sciences)  . Smokeless tobacco: Never Used  Substance and Sexual Activity  . Alcohol use: Yes  . Drug use: No  . Sexual activity: Yes  Lifestyle  . Physical activity:    Days per week: Not on file    Minutes per session: Not on file  . Stress: Not on file  Relationships  . Social connections:    Talks on phone: Not on file    Gets together: Not on file    Attends religious service: Not on file    Active member of club or organization: Not on file    Attends  meetings of clubs or organizations: Not on file    Relationship status: Not on file  . Intimate partner violence:    Fear of current or ex partner: Not on file    Emotionally abused: Not on file    Physically abused: Not on file    Forced sexual activity: Not on file  Other Topics Concern  . Not on file  Social History Narrative  . Not on file   The PMH, PSH, Social History, Family History, Medications, and allergies have been reviewed in [facility], and have been updated if relevant.   Review of Systems  Constitutional: Negative.   HENT: Negative.   Eyes: Negative.   Respiratory: Negative.   Cardiovascular: Negative.   Gastrointestinal: Negative.   Endocrine: Negative.   Genitourinary: Positive for vaginal discharge. Negative for vaginal bleeding.  Musculoskeletal: Negative.   Skin: Negative.   Allergic/Immunologic: Negative.   Neurological: Negative.   Hematological: Negative.   Psychiatric/Behavioral: Negative.   All other systems reviewed and are negative.      Objective:    BP 126/84 (BP Location: Left Arm, Patient Position: Sitting, Cuff Size: Normal)   Pulse 63   Temp 98.1 F (36.7 C) (Oral)   Ht 5' 3.5" (1.613 m)   Wt 141 lb 12.8 oz (64.3 kg)   SpO2 97%   BMI 24.72 kg/m    Physical Exam   General:  Well-developed,well-nourished,in no acute distress; alert,appropriate and cooperative throughout examination Head:  normocephalic and atraumatic.   Eyes:  vision grossly intact, PERRL Ears:  R ear normal and L ear normal externally, TMs clear bilaterally Nose:  no external deformity.   Mouth:  good dentition.   Neck:  No deformities, masses, or tenderness noted. Breasts:  No mass, nodules, thickening, tenderness, bulging, retraction, inflamation, nipple discharge or skin changes noted.   Lungs:  Normal respiratory effort, chest expands symmetrically. Lungs are clear to auscultation, no crackles or wheezes. Heart:  Normal rate and regular rhythm. S1 and S2 normal  without gallop, murmur, click, rub or other extra sounds. Abdomen:  Bowel sounds positive,abdomen soft and non-tender without masses, organomegaly or hernias noted. Rectal:  no external abnormalities.   Genitalia:  Pelvic Exam:        External: normal female genitalia without lesions or masses        Vagina: normal without lesions or masses  Cervix: normal without lesions or masses        Adnexa: normal bimanual exam without masses or fullness        Uterus: normal by palpation        Pap smear: performed Msk:  No deformity or scoliosis noted of thoracic or lumbar spine.   Extremities:  No clubbing, cyanosis, edema, or deformity noted with normal full range of motion of all joints.   Neurologic:  alert & oriented X3 and gait normal.   Skin:  Intact without suspicious lesions or rashes Cervical Nodes:  No lymphadenopathy noted Axillary Nodes:  No palpable lymphadenopathy Psych:  Cognition and judgment appear intact. Alert and cooperative with normal attention span and concentration. No apparent delusions, illusions, hallucinations       Assessment & Plan:   Well woman exam with routine gynecological exam - Plan: Cytology - PAP  Vaginal discharge - Plan: Cytology - PAP No follow-ups on file.

## 2018-01-26 NOTE — Assessment & Plan Note (Signed)
She is taking calcium, vit D and very active.

## 2018-01-26 NOTE — Assessment & Plan Note (Signed)
Likely physiological but she does want screening/wet prep sent with pap today.

## 2018-01-31 LAB — CERVICOVAGINAL ANCILLARY ONLY: HERPES (WINDOWPATH): NEGATIVE

## 2018-02-01 LAB — CYTOLOGY - PAP
Bacterial vaginitis: NEGATIVE
CHLAMYDIA, DNA PROBE: NEGATIVE
Candida vaginitis: NEGATIVE
Diagnosis: NEGATIVE
HPV (WINDOPATH): NOT DETECTED
NEISSERIA GONORRHEA: NEGATIVE
Trichomonas: NEGATIVE

## 2018-03-09 ENCOUNTER — Ambulatory Visit
Admission: RE | Admit: 2018-03-09 | Discharge: 2018-03-09 | Disposition: A | Discharge status: Home / Self Care | Payer: BLUE CROSS/BLUE SHIELD | Source: Ambulatory Visit | Attending: Family Medicine | Admitting: Family Medicine

## 2018-03-09 DIAGNOSIS — Z1231 Encounter for screening mammogram for malignant neoplasm of breast: Secondary | ICD-10-CM

## 2018-04-15 ENCOUNTER — Encounter: Payer: Self-pay | Admitting: Family Medicine

## 2018-04-18 ENCOUNTER — Telehealth: Payer: Self-pay | Admitting: Family Medicine

## 2018-04-18 NOTE — Telephone Encounter (Signed)
Spoke with patient, made aware that we sent email Alicia Valencia ([name] to have charges removed from her account and it can take up to 30 days to have removed. The only charges that will stay on account is the PAP and HPV testing.

## 2018-05-19 DIAGNOSIS — H179 Unspecified corneal scar and opacity: Secondary | ICD-10-CM | POA: Diagnosis not present

## 2018-05-19 DIAGNOSIS — H52213 Irregular astigmatism, bilateral: Secondary | ICD-10-CM | POA: Diagnosis not present

## 2018-05-23 DIAGNOSIS — H179 Unspecified corneal scar and opacity: Secondary | ICD-10-CM | POA: Diagnosis not present

## 2018-06-13 DIAGNOSIS — H179 Unspecified corneal scar and opacity: Secondary | ICD-10-CM | POA: Diagnosis not present

## 2018-06-13 DIAGNOSIS — H52213 Irregular astigmatism, bilateral: Secondary | ICD-10-CM | POA: Diagnosis not present

## 2018-06-27 DIAGNOSIS — H179 Unspecified corneal scar and opacity: Secondary | ICD-10-CM | POA: Diagnosis not present

## 2018-08-31 DIAGNOSIS — L570 Actinic keratosis: Secondary | ICD-10-CM | POA: Diagnosis not present

## 2018-08-31 DIAGNOSIS — D2262 Melanocytic nevi of left upper limb, including shoulder: Secondary | ICD-10-CM | POA: Diagnosis not present

## 2018-08-31 DIAGNOSIS — X32XXXA Exposure to sunlight, initial encounter: Secondary | ICD-10-CM | POA: Diagnosis not present

## 2018-08-31 DIAGNOSIS — Z85828 Personal history of other malignant neoplasm of skin: Secondary | ICD-10-CM | POA: Diagnosis not present

## 2018-08-31 DIAGNOSIS — D225 Melanocytic nevi of trunk: Secondary | ICD-10-CM | POA: Diagnosis not present

## 2018-08-31 DIAGNOSIS — D2261 Melanocytic nevi of right upper limb, including shoulder: Secondary | ICD-10-CM | POA: Diagnosis not present

## 2019-01-20 ENCOUNTER — Other Ambulatory Visit: Payer: Self-pay | Admitting: Family Medicine

## 2019-01-20 DIAGNOSIS — Z1231 Encounter for screening mammogram for malignant neoplasm of breast: Secondary | ICD-10-CM

## 2019-01-27 DIAGNOSIS — X32XXXA Exposure to sunlight, initial encounter: Secondary | ICD-10-CM | POA: Diagnosis not present

## 2019-01-27 DIAGNOSIS — L570 Actinic keratosis: Secondary | ICD-10-CM | POA: Diagnosis not present

## 2019-01-27 DIAGNOSIS — D485 Neoplasm of uncertain behavior of skin: Secondary | ICD-10-CM | POA: Diagnosis not present

## 2019-01-27 DIAGNOSIS — D2261 Melanocytic nevi of right upper limb, including shoulder: Secondary | ICD-10-CM | POA: Diagnosis not present

## 2019-01-27 DIAGNOSIS — D045 Carcinoma in situ of skin of trunk: Secondary | ICD-10-CM | POA: Diagnosis not present

## 2019-01-27 DIAGNOSIS — Z85828 Personal history of other malignant neoplasm of skin: Secondary | ICD-10-CM | POA: Diagnosis not present

## 2019-01-27 DIAGNOSIS — D2271 Melanocytic nevi of right lower limb, including hip: Secondary | ICD-10-CM | POA: Diagnosis not present

## 2019-01-27 DIAGNOSIS — D2262 Melanocytic nevi of left upper limb, including shoulder: Secondary | ICD-10-CM | POA: Diagnosis not present

## 2019-01-29 NOTE — Progress Notes (Signed)
Subjective:   Patient ID: Alicia Valencia, female    DOB: 01-08-1956, 63 y.o.   MRN: [id number]  Alicia Valencia is a pleasant 63 y.o. year old female who presents to clinic today with Annual Exam (Pt is here today for a CPE without PAP. PAP next due 10.23.22. Tdap done in 2012, pt already had FLU shot. PHQ form done and pt is fasting.) and Allergies (Pt c/o having to clear her throat constently, x 35month)  on 02/01/2019  HPI:  Health Maintenance  Topic Date Due  . MAMMOGRAM  03/09/2020  . TETANUS/TDAP  10/29/2020  . PAP SMEAR-Modifier  01/26/2021  . COLONOSCOPY  07/14/2024  . INFLUENZA VACCINE  Completed  . Hepatitis C Screening  Discontinued  . HIV Screening  Discontinued     Last pap smear 01/26/18- no history of abnormal pap smear or post menopausal bleeding. No family history of breast, uterine cancer.  Colonoscopy 04/2012.  Mammogram 03/09/18.  Next mammogram scheduled for 03/15/19.  Osteopenia- last DEXA was done on 10/29/16- T score of -2.1.  She is taking Citracal and walking often.  Lab Results  Component Value Date   VD25OH 31.72 12/20/2014   Clearing throat- on going for a month.  Does not seem to be worse with certain foods or time of day.  Depression screen [facility] 2/9 02/01/2019 01/26/2018 12/23/2016  Decreased Interest 0 0 0  Down, Depressed, Hopeless 0 0 0  PHQ - 2 Score 0 0 0  Altered sleeping 0 - -  Tired, decreased energy 0 - -  Change in appetite 0 - -  Feeling bad or failure about yourself  0 - -  Trouble concentrating 0 - -  Moving slowly or fidgety/restless 0 - -  Suicidal thoughts 0 - -  PHQ-9 Score 0 - -  Difficult doing work/chores Not difficult at all - -   GAD 7 : Generalized Anxiety Score 02/01/2019  Nervous, Anxious, on Edge 0  Control/stop worrying 0  Worry too much - different things 0  Trouble relaxing 0  Restless 0  Easily annoyed or irritable 0  Afraid - awful might happen 0  Total GAD 7 Score 0  Anxiety Difficulty Not difficult at  all       Lab Results  Component Value Date   CHOL 164 01/19/2018   HDL 70.80 01/19/2018   LDLCALC 76 01/19/2018   TRIG 87.0 01/19/2018   CHOLHDL 2 01/19/2018   Lab Results  Component Value Date   CREATININE 0.82 01/19/2018   Lab Results  Component Value Date   TSH 0.81 01/19/2018   Lab Results  Component Value Date   WBC 3.4 (L) 01/19/2018   HGB 12.5 01/19/2018   HCT 37.2 01/19/2018   MCV 91.7 01/19/2018   PLT 218.0 01/19/2018   Lab Results  Component Value Date   NA 139 01/19/2018   K 3.8 01/19/2018   CL 104 01/19/2018   CO2 28 01/19/2018   Lab Results  Component Value Date   ALT 22 01/19/2018   AST 19 01/19/2018   ALKPHOS 78 01/19/2018   BILITOT 0.4 01/19/2018   The 10-year ASCVD risk score Mikey Bussing DC Jr., et al., 2013) is: 2%   Values used to calculate the score:     Age: 65 years     Sex: Female     Is Non-Hispanic African American: No     Diabetic: No     Tobacco smoker: No     Systolic Blood Pressure: [id number]  mmHg     Is BP treated: No     HDL Cholesterol: 70.8 mg/dL     Total Cholesterol: 164 mg/dL    Current Outpatient Medications on File Prior to Visit  Medication Sig Dispense Refill  . calcium citrate-vitamin D (CITRACAL+D) 315-200 MG-UNIT tablet Take by mouth.    . Multiple Vitamins-Minerals (MULTIVITAMIN ADULTS 50+) TABS Take 1 tablet by mouth daily.    . Multiple Vitamins-Minerals (PRESERVISION AREDS 2 PO) Take 2 tablets by mouth daily.    . Omega-3 Fatty Acids (FISH OIL PO) Take 1,500 mg by mouth daily.    . betamethasone dipropionate 0.05 % cream APP EXT TO RASH BID PRN.     No current facility-administered medications on file prior to visit.     No Known Allergies  Past Medical History:  Diagnosis Date  . History of fainting spells of unknown cause     Past Surgical History:  Procedure Laterality Date  . BREAST BIOPSY Left    negative core  . CESAREAN SECTION    . THYROIDECTOMY      Family History  Problem Relation Age of  Onset  . Heart disease Mother   . Hypertension Mother   . Diabetes Mother   . Diabetes Father   . Hypertension Sister   . Diabetes Maternal Aunt   . Diabetes Maternal Uncle   . Diabetes Maternal Grandmother   . Diabetes Maternal Grandfather   . Breast cancer Neg Hx     Social History   Socioeconomic History  . Marital status: Married    Spouse name: Not on file  . Number of children: Not on file  . Years of education: Not on file  . Highest education level: Not on file  Occupational History  . Not on file  Social Needs  . Financial resource strain: Not on file  . Food insecurity    Worry: Not on file    Inability: Not on file  . Transportation needs    Medical: Not on file    Non-medical: Not on file  Tobacco Use  . Smoking status: Former Research scientist (life sciences)  . Smokeless tobacco: Never Used  Substance and Sexual Activity  . Alcohol use: Yes  . Drug use: No  . Sexual activity: Yes  Lifestyle  . Physical activity    Days per week: Not on file    Minutes per session: Not on file  . Stress: Not on file  Relationships  . Social Herbalist on phone: Not on file    Gets together: Not on file    Attends religious service: Not on file    Active member of club or organization: Not on file    Attends meetings of clubs or organizations: Not on file    Relationship status: Not on file  . Intimate partner violence    Fear of current or ex partner: Not on file    Emotionally abused: Not on file    Physically abused: Not on file    Forced sexual activity: Not on file  Other Topics Concern  . Not on file  Social History Narrative  . Not on file   The PMH, PSH, Social History, Family History, Medications, and allergies have been reviewed in [facility], and have been updated if relevant.   Review of Systems  Constitutional: Negative.   HENT: Negative.  Negative for trouble swallowing.        Clearing throat  Eyes: Negative.   Respiratory: Negative.  Cardiovascular: Negative.    Gastrointestinal: Negative.   Endocrine: Negative.   Genitourinary: Negative for vaginal bleeding and vaginal discharge.  Musculoskeletal: Negative.   Skin: Negative.   Allergic/Immunologic: Negative.   Neurological: Negative.   Hematological: Negative.   Psychiatric/Behavioral: Negative.   All other systems reviewed and are negative.      Objective:    BP 110/80 (BP Location: Right Arm, Patient Position: Sitting, Cuff Size: Normal)   Pulse 89   Temp 98.6 F (37 C) (Oral)   Ht 5' 4.25" (1.632 m)   Wt 141 lb 12.8 oz (64.3 kg)   SpO2 97%   BMI 24.15 kg/m    Physical Exam   General:  Well-developed,well-nourished,in no acute distress; alert,appropriate and cooperative throughout examination Head:  normocephalic and atraumatic.   Eyes:  vision grossly intact, PERRL Ears:  R ear normal and L ear normal externally, TMs clear bilaterally Nose:  no external deformity.   Mouth:  good dentition.   Neck:  No deformities, masses, or tenderness noted. Breasts:  No mass, nodules, thickening, tenderness, bulging, retraction, inflamation, nipple discharge or skin changes noted.   Lungs:  Normal respiratory effort, chest expands symmetrically. Lungs are clear to auscultation, no crackles or wheezes. Heart:  Normal rate and regular rhythm. S1 and S2 normal without gallop, murmur, click, rub or other extra sounds. Abdomen:  Bowel sounds positive,abdomen soft and non-tender without masses, organomegaly or hernias noted. Rectal:  no external abnormalities.   Genitalia:  Pelvic Exam:        External: normal female genitalia without lesions or masses        Vagina: normal without lesions or masses        Cervix: normal without lesions or masses        Adnexa: normal bimanual exam without masses or fullness        Uterus: normal by palpation        Pap smear: performed Msk:  No deformity or scoliosis noted of thoracic or lumbar spine.   Extremities:  No clubbing, cyanosis, edema, or  deformity noted with normal full range of motion of all joints.   Neurologic:  alert & oriented X3 and gait normal.   Skin:  Intact without suspicious lesions or rashes Cervical Nodes:  No lymphadenopathy noted Axillary Nodes:  No palpable lymphadenopathy Psych:  Cognition and judgment appear intact. Alert and cooperative with normal attention span and concentration. No apparent delusions, illusions, hallucinations       Assessment & Plan:   Well woman exam without gynecological exam  Osteopenia, unspecified location - Plan: DG Bone Density  Post-menopausal - Plan: DG Bone Density  Disorder of bone - Plan: Vitamin D (25 hydroxy)  Hyperlipidemia, unspecified hyperlipidemia type - Plan: CBC with Differential/Platelet, Comprehensive metabolic panel, Lipid panel, TSH  History of thyroid surgery - Plan: TSH, T4, free  Gastroesophageal reflux disease, unspecified whether esophagitis present - Plan: H. pylori breath test No follow-ups on file.

## 2019-01-31 ENCOUNTER — Other Ambulatory Visit: Payer: Self-pay

## 2019-02-01 ENCOUNTER — Ambulatory Visit (INDEPENDENT_AMBULATORY_CARE_PROVIDER_SITE_OTHER): Payer: BC Managed Care – PPO | Admitting: Family Medicine

## 2019-02-01 ENCOUNTER — Encounter: Payer: Self-pay | Admitting: Family Medicine

## 2019-02-01 VITALS — BP 110/80 | HR 89 | Temp 98.6°F | Ht 64.25 in | Wt 141.8 lb

## 2019-02-01 DIAGNOSIS — E785 Hyperlipidemia, unspecified: Secondary | ICD-10-CM

## 2019-02-01 DIAGNOSIS — M8580 Other specified disorders of bone density and structure, unspecified site: Secondary | ICD-10-CM | POA: Diagnosis not present

## 2019-02-01 DIAGNOSIS — M899 Disorder of bone, unspecified: Secondary | ICD-10-CM | POA: Diagnosis not present

## 2019-02-01 DIAGNOSIS — Z0000 Encounter for general adult medical examination without abnormal findings: Secondary | ICD-10-CM

## 2019-02-01 DIAGNOSIS — Z9889 Other specified postprocedural states: Secondary | ICD-10-CM | POA: Diagnosis not present

## 2019-02-01 DIAGNOSIS — K219 Gastro-esophageal reflux disease without esophagitis: Secondary | ICD-10-CM

## 2019-02-01 DIAGNOSIS — Z780 Asymptomatic menopausal state: Secondary | ICD-10-CM

## 2019-02-01 DIAGNOSIS — R6889 Other general symptoms and signs: Secondary | ICD-10-CM

## 2019-02-01 DIAGNOSIS — R0989 Other specified symptoms and signs involving the circulatory and respiratory systems: Secondary | ICD-10-CM

## 2019-02-01 LAB — CBC WITH DIFFERENTIAL/PLATELET
Basophils Absolute: 0 10*3/uL (ref 0.0–0.1)
Basophils Relative: 1.1 % (ref 0.0–3.0)
Eosinophils Absolute: 0.1 10*3/uL (ref 0.0–0.7)
Eosinophils Relative: 3 % (ref 0.0–5.0)
HCT: 35.6 % — ABNORMAL LOW (ref 36.0–46.0)
Hemoglobin: 11.6 g/dL — ABNORMAL LOW (ref 12.0–15.0)
Lymphocytes Relative: 44.1 % (ref 12.0–46.0)
Lymphs Abs: 1.3 10*3/uL (ref 0.7–4.0)
MCHC: 32.6 g/dL (ref 30.0–36.0)
MCV: 84.1 fl (ref 78.0–100.0)
Monocytes Absolute: 0.2 10*3/uL (ref 0.1–1.0)
Monocytes Relative: 8.1 % (ref 3.0–12.0)
Neutro Abs: 1.3 10*3/uL — ABNORMAL LOW (ref 1.4–7.7)
Neutrophils Relative %: 43.7 % (ref 43.0–77.0)
Platelets: 277 10*3/uL (ref 150.0–400.0)
RBC: 4.23 Mil/uL (ref 3.87–5.11)
RDW: 14.9 % (ref 11.5–15.5)
WBC: 3 10*3/uL — ABNORMAL LOW (ref 4.0–10.5)

## 2019-02-01 LAB — COMPREHENSIVE METABOLIC PANEL
ALT: 21 U/L (ref 0–35)
AST: 19 U/L (ref 0–37)
Albumin: 4.2 g/dL (ref 3.5–5.2)
Alkaline Phosphatase: 78 U/L (ref 39–117)
BUN: 12 mg/dL (ref 6–23)
CO2: 30 mEq/L (ref 19–32)
Calcium: 9.7 mg/dL (ref 8.4–10.5)
Chloride: 106 mEq/L (ref 96–112)
Creatinine, Ser: 0.73 mg/dL (ref 0.40–1.20)
GFR: 80.85 mL/min (ref >60.00)
Glucose, Bld: 96 mg/dL (ref 70–99)
Potassium: 3.7 mEq/L (ref 3.5–5.1)
Sodium: 140 mEq/L (ref 135–145)
Total Bilirubin: 0.4 mg/dL (ref 0.2–1.2)
Total Protein: 6.3 g/dL (ref 6.0–8.3)

## 2019-02-01 LAB — LIPID PANEL
Cholesterol: 174 mg/dL (ref 0–200)
HDL: 66.3 mg/dL (ref >39.00)
LDL Cholesterol: 93 mg/dL (ref 0–99)
NonHDL: 108
Total CHOL/HDL Ratio: 3
Triglycerides: 77 mg/dL (ref 0.0–149.0)
VLDL: 15.4 mg/dL (ref 0.0–40.0)

## 2019-02-01 NOTE — Assessment & Plan Note (Signed)
Ordered DEXA- advised to call [facility] to schedule bone density on same day as mammogram.

## 2019-02-01 NOTE — Patient Instructions (Addendum)
Great to see you. I will call you with your lab results from today and you can view them online.   Please call the [facility] at [phone] to schedule your boned density scan which can likely  be scheduled on the same date that you your mammogram is scheduled- 03/15/19.   Try to get most or all of your calcium from your food--aim for 70 and 1200 mg/day for women over 50 and men over 70.  To figure out dietary calcium: 300 mg/day from all non dairy foods plus 300 mg per cup of milk, other dairy, or fortified juice.  Non dairy foods that contain calcium:  Kale, oranges, sardines, oatmeal, soy milk/soybeans, salmon, white beans, dried figs, turnip greens, almonds, broccoli, tofu.    Eat foods that contain vitamin D, such as: ? Dairy products, cereals, or juices with added vitamin D. Check the label. ? Fish, such as salmon or trout. ? Eggs. ? Oysters. ? Mushrooms.     Food Choices for Gastroesophageal Reflux Disease, Adult When you have gastroesophageal reflux disease (GERD), the foods you eat and your eating habits are very important. Choosing the right foods can help ease your discomfort. Think about working with a nutrition specialist (dietitian) to help you make good choices. What are tips for following this plan?  Meals  Choose healthy foods that are low in fat, such as fruits, vegetables, whole grains, low-fat dairy products, and lean meat, fish, and poultry.  Eat small meals often instead of 3 large meals a day. Eat your meals slowly, and in a place where you are relaxed. Avoid bending over or lying down until 2-3 hours after eating.  Avoid eating meals 2-3 hours before bed.  Avoid drinking a lot of liquid with meals.  Cook foods using methods other than frying. Bake, grill, or broil food instead.  Avoid or limit: ? Chocolate. ? Peppermint or spearmint. ? Alcohol. ? Pepper. ? Black and decaffeinated coffee. ? Black and decaffeinated tea. ? Bubbly  (carbonated) soft drinks. ? Caffeinated energy drinks and soft drinks.  Limit high-fat foods such as: ? Fatty meat or fried foods. ? Whole milk, cream, butter, or ice cream. ? Nuts and nut butters. ? Pastries, donuts, and sweets made with butter or shortening.  Avoid foods that cause symptoms. These foods may be different for everyone. Common foods that cause symptoms include: ? Tomatoes. ? Oranges, lemons, and limes. ? Peppers. ? Spicy food. ? Onions and garlic. ? Vinegar. Lifestyle  Maintain a healthy weight. Ask your doctor what weight is healthy for you. If you need to lose weight, work with your doctor to do so safely.  Exercise for at least 30 minutes for 5 or more days each week, or as told by your doctor.  Wear loose-fitting clothes.  Do not smoke. If you need help quitting, ask your doctor.  Sleep with the head of your bed higher than your feet. Use a wedge under the mattress or blocks under the bed frame to raise the head of the bed.

## 2019-02-01 NOTE — Assessment & Plan Note (Signed)
Reviewed preventive care protocols, scheduled due services, and updated immunizations Discussed nutrition, exercise, diet, and healthy lifestyle.  Has mammogram ordered- breast exam done today.

## 2019-02-01 NOTE — Assessment & Plan Note (Signed)
?   Reflux?  Dairy foods- discussed GERD friendly diet, non diary sources of calcium.  See AVS.

## 2019-02-01 NOTE — Addendum Note (Signed)
Addended by: Lynnea Ferrier on: 02/01/2019 11:17 AM   Modules accepted: Orders

## 2019-02-02 ENCOUNTER — Other Ambulatory Visit: Payer: Self-pay | Admitting: Family Medicine

## 2019-02-02 ENCOUNTER — Other Ambulatory Visit: Payer: Self-pay

## 2019-02-02 LAB — IRON,?TOTAL/TOTAL IRON BINDING CAP: %SAT: 7 % (calc) — ABNORMAL LOW (ref 16–45)

## 2019-02-02 LAB — TSH: TSH: 0.65 u[IU]/mL (ref 0.35–4.50)

## 2019-02-02 LAB — T4, FREE: Free T4: 0.77 ng/dL (ref 0.60–1.60)

## 2019-02-02 LAB — IRON, TOTAL/TOTAL IRON BINDING CAP
Iron: 26 ug/dL — ABNORMAL LOW (ref 45–160)
TIBC: 384 mcg/dL (calc) (ref 250–450)

## 2019-02-02 LAB — VITAMIN D 25 HYDROXY (VIT D DEFICIENCY, FRACTURES): VITD: 40.96 ng/mL (ref 30.00–100.00)

## 2019-02-02 LAB — H. PYLORI BREATH TEST: H. pylori Breath Test: DETECTED — AB

## 2019-02-02 NOTE — Progress Notes (Signed)
Please call pt- 1.  She DOES have H pylori which is that infection of the gut that can lead to worsening reflux, burping fatigue and could explain many of her symptoms- this is treatable and hopefully will make her feel much better. Please confirm her allergies and send in erxs which I already have pended below.  2.  She is iron deficient.  Is she taking any OTC iron?  Has she noticed any blood in her stool or urine?  Is her colonoscopy up to date?  I will also pend iron for her pharmacy to send in as well.

## 2019-02-02 NOTE — Progress Notes (Signed)
error 

## 2019-02-03 ENCOUNTER — Other Ambulatory Visit: Payer: Self-pay | Admitting: Family Medicine

## 2019-02-03 MED ORDER — AMOXICILLIN 500 MG PO CAPS: 1000.0000 mg | ORAL_CAPSULE | Freq: Two times a day (BID) | ORAL | 0 refills | Status: DC

## 2019-02-03 MED ORDER — CLARITHROMYCIN 500 MG PO TABS: 500.0000 mg | ORAL_TABLET | Freq: Two times a day (BID) | ORAL | 0 refills | Status: DC

## 2019-02-03 MED ORDER — FERROUS SULFATE 325 (65 FE) MG PO TBEC: 325.0000 mg | DELAYED_RELEASE_TABLET | Freq: Every day | ORAL | 3 refills | Status: DC

## 2019-02-03 MED ORDER — OMEPRAZOLE 20 MG PO CPDR: 20.0000 mg | DELAYED_RELEASE_CAPSULE | Freq: Two times a day (BID) | ORAL | 0 refills | Status: DC

## 2019-02-03 NOTE — Telephone Encounter (Signed)
Requested medication (s) are due for refill today: yes  Requested medication (s) are on the active medication list: yes  Last refill:  02/03/2019  Future visit scheduled: no  Notes to clinic: Patient would like to have script sent to [facility] of [facility]   Requested Prescriptions  Pending Prescriptions Disp Refills   amoxicillin (AMOXIL) 500 MG capsule 56 capsule 0    Sig: Take 2 capsules (1,000 mg total) by mouth 2 (two) times daily for 14 days.     Off-Protocol Failed - 02/03/2019  2:51 PM      Failed - Medication not assigned to a protocol, review manually.      Passed - Valid encounter within last 12 months    Recent Outpatient Visits          2 days ago Well woman exam without gynecological exam   [facility], Marciano Sequin, MD   1 year ago Well woman exam with routine gynecological exam   [facility], Marciano Sequin, MD              clarithromycin (BIAXIN) 500 MG tablet 28 tablet 0    Sig: Take 1 tablet (500 mg total) by mouth 2 (two) times daily for 14 days.     Off-Protocol Failed - 02/03/2019  2:51 PM      Failed - Medication not assigned to a protocol, review manually.      Passed - Valid encounter within last 12 months    Recent Outpatient Visits          2 days ago Well woman exam without gynecological exam   [facility], Marciano Sequin, MD   1 year ago Well woman exam with routine gynecological exam   [facility], Marciano Sequin, MD              ferrous sulfate 325 (65 FE) MG EC tablet 30 tablet 3    Sig: Take 1 tablet (325 mg total) by mouth daily with breakfast.     Endocrinology:  Minerals - Iron Supplementation Failed - 02/03/2019  2:51 PM      Failed - HGB in normal range and within 360 days    Hemoglobin  Date Value Ref Range Status  02/01/2019 11.6 (L) 12.0 - 15.0 g/dL Final         Failed - HCT in normal range and within 360 days    HCT  Date Value  Ref Range Status  02/01/2019 35.6 (L) 36.0 - 46.0 % Final         Failed - Fe (serum) in normal range and within 360 days    Iron  Date Value Ref Range Status  02/01/2019 26 (L) 45 - 160 mcg/dL Final   %SAT  Date Value Ref Range Status  02/01/2019 7 (L) 16 - 45 % (calc) Final         Failed - Ferritin in normal range and within 360 days    No results found for: FERRITIN       Passed - RBC in normal range and within 360 days    RBC  Date Value Ref Range Status  02/01/2019 4.23 3.87 - 5.11 Mil/uL Final         Passed - Valid encounter within last 12 months    Recent Outpatient Visits          2 days ago Well woman exam without gynecological exam  [facility], Marciano Sequin, MD   1 year ago Well woman exam with routine gynecological exam   [facility] [place] [facility], Marciano Sequin, MD              omeprazole (PRILOSEC) 20 MG capsule 28 capsule 0    Sig: Take 1 capsule (20 mg total) by mouth 2 (two) times daily before a meal for 14 days.     Gastroenterology: Proton Pump Inhibitors Passed - 02/03/2019  2:51 PM      Passed - Valid encounter within last 12 months    Recent Outpatient Visits          2 days ago Well woman exam without gynecological exam   [facility], Marciano Sequin, MD   1 year ago Well woman exam with routine gynecological exam   [facility] [address], Marciano Sequin, MD

## 2019-02-03 NOTE — Telephone Encounter (Signed)
Medication: amoxicillin (AMOXIL) 500 MG capsule [id number] , clarithromycin (BIAXIN) 500 MG tablet [id number], ferrous sulfate 325 (65 FE) MG EC tablet [id number] ,omeprazole (PRILOSEC) 20 MG capsule [id number]    Medication have been sent to the wrong pharmacy. Can they be sent to pharmacy below?  Has the patient contacted their pharmacy? Yes  (Agent: If no, request that the patient contact the pharmacy for the refill.) (Agent: If yes, when and what did the pharmacy advise?)  Preferred Pharmacy (with phone number or street name): [facility] [id number], [place] [phone] (Phone) [phone] (Fax)   Agent: Please be advised that RX refills may take up to 3 business days. We ask that you follow-up with your pharmacy.

## 2019-02-03 NOTE — Progress Notes (Signed)
I spoke with pt yesterday afternoon. There is another encounter regarding this message. Rx sent to pharmacy today.

## 2019-02-04 MED ORDER — OMEPRAZOLE 20 MG PO CPDR: 20.0000 mg | DELAYED_RELEASE_CAPSULE | Freq: Two times a day (BID) | ORAL | 0 refills | Status: DC

## 2019-02-04 MED ORDER — CLARITHROMYCIN 500 MG PO TABS: 500.0000 mg | ORAL_TABLET | Freq: Two times a day (BID) | ORAL | 0 refills | Status: AC

## 2019-02-04 MED ORDER — AMOXICILLIN 500 MG PO CAPS: 1000.0000 mg | ORAL_CAPSULE | Freq: Two times a day (BID) | ORAL | 0 refills | Status: AC

## 2019-02-04 MED ORDER — FERROUS SULFATE 325 (65 FE) MG PO TBEC: 325.0000 mg | DELAYED_RELEASE_TABLET | Freq: Every day | ORAL | 3 refills | Status: DC

## 2019-02-06 NOTE — Progress Notes (Signed)
I had sent them back to you.  I didn't see how I could send them in.  I see you sent them in on Sat, 02/04/19.

## 2019-02-13 ENCOUNTER — Encounter: Payer: Self-pay | Admitting: Family Medicine

## 2019-03-02 ENCOUNTER — Encounter: Payer: Self-pay | Admitting: Family Medicine

## 2019-03-15 ENCOUNTER — Encounter: Payer: Self-pay | Admitting: Family Medicine

## 2019-03-15 ENCOUNTER — Ambulatory Visit
Admission: RE | Admit: 2019-03-15 | Discharge: 2019-03-15 | Disposition: A | Discharge status: Home / Self Care | Payer: BC Managed Care – PPO | Source: Ambulatory Visit | Attending: Family Medicine | Admitting: Family Medicine

## 2019-03-15 ENCOUNTER — Other Ambulatory Visit: Payer: Self-pay

## 2019-03-15 DIAGNOSIS — Z780 Asymptomatic menopausal state: Secondary | ICD-10-CM | POA: Diagnosis not present

## 2019-03-15 DIAGNOSIS — Z1231 Encounter for screening mammogram for malignant neoplasm of breast: Secondary | ICD-10-CM

## 2019-03-15 DIAGNOSIS — M85852 Other specified disorders of bone density and structure, left thigh: Secondary | ICD-10-CM | POA: Diagnosis not present

## 2019-03-15 DIAGNOSIS — M8580 Other specified disorders of bone density and structure, unspecified site: Secondary | ICD-10-CM | POA: Insufficient documentation

## 2019-03-15 DIAGNOSIS — M810 Age-related osteoporosis without current pathological fracture: Secondary | ICD-10-CM | POA: Diagnosis not present

## 2019-03-15 NOTE — Progress Notes (Signed)
Virtual Visit via Video   Due to the COVID-19 pandemic, this visit was completed with telemedicine (audio/video) technology to reduce patient and provider exposure as well as to preserve personal protective equipment.   I connected with Alicia Valencia by a video enabled telemedicine application and verified that I am speaking with the correct person using two identifiers. Location patient: Home Location provider: [facility] Office Persons participating in the virtual visit: Eylin String, Arnette Norris, MD   I discussed the limitations of evaluation and management by telemedicine and the availability of in person appointments. The patient expressed understanding and agreed to proceed.  Care Team   Patient Care Team: Lucille Passy, MD as PCP - General (Family Medicine)  Subjective:   HPI:   Osteoporosis-  DEXA done yesterday showed that her osteopenia progressed to osteoporosis. Currently taking Caltrate.    She has had post menopausal fractures.  Lab Results  Component Value Date   VD25OH 40.96 02/01/2019   VD25OH 31.72 12/20/2014    Calcium 9.7 on 02/01/19.  Dg Bone Density  Result Date: 03/15/2019 EXAM: DUAL X-RAY ABSORPTIOMETRY (DXA) FOR BONE MINERAL DENSITY IMPRESSION: Technologist: SCE PATIENT BIOGRAPHICAL: Name: Alicia Valencia, Alicia Valencia Patient ID: [id number] Birth Date: 1955-09-24 Height: 63.5 in. Gender: Female Exam Date: 03/15/2019 Weight: 143.6 lbs. Indications: Caucasian, Osteopenia, Postmenopausal Fractures: Treatments: calcium w/ vit D, Multi-Vitamin, Omeprazole ASSESSMENT: The BMD measured at AP Spine L1-L4 is 0.889 g/cm2 with a T-score of -2.5. This patient is considered osteoporotic according to [facility]) criteria. The scan quality is good. Site Region Measured Measured WHO Young Adult BMD Date       Age      Classification T-score AP Spine L1-L4 03/15/2019 61.8 Osteoporosis -2.5 0.889 g/cm2 AP Spine L1-L4 10/29/2016 59.4 Osteopenia -2.1 0.938 g/cm2  DualFemur Neck Left 03/15/2019 61.8 Osteopenia -1.6 0.809 g/cm2 DualFemur Neck Left 10/29/2016 59.4 Osteopenia -1.6 0.817 g/cm2 DualFemur Total Mean 03/15/2019 61.8 Normal -1.0 0.884 g/cm2 DualFemur Total Mean 10/29/2016 59.4 Normal -0.8 0.910 g/cm2 World Health Organization [facility]) criteria for post-menopausal, Caucasian Women: Normal:       T-score at or above -1 SD Osteopenia:   T-score between -1 and -2.5 SD Osteoporosis: T-score at or below -2.5 SD RECOMMENDATIONS: 1. All patients should optimize calcium and vitamin D intake. 2. Consider FDA-approved medical therapies in postmenopausal women and men aged 30 years and older, based on the following: a. A hip or vertebral(clinical or morphometric) fracture b. T-score < -2.5 at the femoral neck or spine after appropriate evaluation to exclude secondary causes c. Low bone mass (T-score between -1.0 and -2.5 at the femoral neck or spine) and a 10-year probability of a hip fracture > 3% or a 10-year probability of a major osteoporosis-related fracture > 20% based on the [place]-adapted WHO algorithm d. Clinician judgment and/or patient preferences may indicate treatment for people with 10-year fracture probabilities above or below these levels FOLLOW-UP: People with diagnosed cases of osteoporosis or at high risk for fracture should have regular bone mineral density tests. For patients eligible for Medicare, routine testing is allowed once every 2 years. The testing frequency can be increased to one year for patients who have rapidly progressing disease, those who are receiving or discontinuing medical therapy to restore bone mass, or have additional risk factors. I have reviewed this report, and agree with the above findings. [facility] Electronically Signed   By: Marin Olp M.D.   On: 03/15/2019 10:09        Review  of Systems  Constitutional: Negative for fever and malaise/fatigue.  HENT: Negative for congestion and hearing loss.   Eyes: Negative for  blurred vision, discharge and redness.  Respiratory: Negative for cough and shortness of breath.   Cardiovascular: Negative for chest pain, palpitations and leg swelling.  Gastrointestinal: Negative for abdominal pain and heartburn.  Genitourinary: Negative for dysuria.  Musculoskeletal: Negative for falls.  Skin: Negative for rash.  Neurological: Negative for loss of consciousness and headaches.  Endo/Heme/Allergies: Does not bruise/bleed easily.  Psychiatric/Behavioral: Negative for depression.  All other systems reviewed and are negative.    Patient Active Problem List   Diagnosis Date Noted   Throat clearing 02/01/2019   Osteoporosis 12/23/2016   Well woman exam without gynecological exam 12/20/2014    Social History   Tobacco Use   Smoking status: Former Smoker   Smokeless tobacco: Never Used  Substance Use Topics   Alcohol use: Yes    Current Outpatient Medications:    betamethasone dipropionate 0.05 % cream, APP EXT TO RASH BID PRN., Disp: , Rfl:    calcium citrate-vitamin D (CITRACAL+D) 315-200 MG-UNIT tablet, Take by mouth., Disp: , Rfl:    ferrous sulfate 325 (65 FE) MG EC tablet, Take 1 tablet (325 mg total) by mouth daily with breakfast., Disp: 30 tablet, Rfl: 3   Multiple Vitamins-Minerals (MULTIVITAMIN ADULTS 50+) TABS, Take 1 tablet by mouth daily., Disp: , Rfl:    Multiple Vitamins-Minerals (PRESERVISION AREDS 2 PO), Take 2 tablets by mouth daily., Disp: , Rfl:    Omega-3 Fatty Acids (FISH OIL PO), Take 1,500 mg by mouth daily., Disp: , Rfl:    omeprazole (PRILOSEC) 20 MG capsule, Take 1 capsule (20 mg total) by mouth 2 (two) times daily before a meal for 14 days., Disp: 28 capsule, Rfl: 0  No Known Allergies  Objective:  Ht 5' 4.25" (1.632 m)    Wt 141 lb (64 kg)    BMI 24.01 kg/m   VITALS: Per patient if applicable, see vitals. GENERAL: Alert, appears well and in no acute distress. HEENT: Atraumatic, conjunctiva clear, no obvious  abnormalities on inspection of external nose and ears. NECK: Normal movements of the head and neck. CARDIOPULMONARY: No increased WOB. Speaking in clear sentences. I:E ratio WNL.  MS: Moves all visible extremities without noticeable abnormality. PSYCH: Pleasant and cooperative, well-groomed. Speech normal rate and rhythm. Affect is appropriate. Insight and judgement are appropriate. Attention is focused, linear, and appropriate.  NEURO: CN grossly intact. Oriented as arrived to appointment on time with no prompting. Moves both UE equally.  SKIN: No obvious lesions, wounds, erythema, or cyanosis noted on face or hands.  Depression screen [facility] 2/9 02/01/2019 01/26/2018 12/23/2016  Decreased Interest 0 0 0  Down, Depressed, Hopeless 0 0 0  PHQ - 2 Score 0 0 0  Altered sleeping 0 - -  Tired, decreased energy 0 - -  Change in appetite 0 - -  Feeling bad or failure about yourself  0 - -  Trouble concentrating 0 - -  Moving slowly or fidgety/restless 0 - -  Suicidal thoughts 0 - -  PHQ-9 Score 0 - -  Difficult doing work/chores Not difficult at all - -      COVID-19 Education: The signs and symptoms of COVID-19 were discussed with the patient and how to seek care for testing if needed. The importance of social distancing was discussed today.  Reviewed expectations re: course of current medical issues.  Discussed self-management of symptoms.  Outlined  signs and symptoms indicating need for more acute intervention.  Patient verbalized understanding and all questions were answered.  Health Maintenance issues including appropriate healthy diet, exercise, and smoking avoidance were discussed with patient.  See orders for this visit as documented in the electronic medical record.   Records requested if needed. Time spent: 25 minutes, of which >50% was spent in obtaining information about her symptoms, reviewing her previous labs, evaluations, and treatments, counseling her about her  condition (please see the discussed topics above), and developing a plan to further investigate it; she had a number of questions which I addressed.   Lab Results  Component Value Date   WBC 3.0 (L) 02/01/2019   HGB 11.6 (L) 02/01/2019   HCT 35.6 (L) 02/01/2019   PLT 277.0 02/01/2019   GLUCOSE 96 02/01/2019   CHOL 174 02/01/2019   TRIG 77.0 02/01/2019   HDL 66.30 02/01/2019   LDLCALC 93 02/01/2019   ALT 21 02/01/2019   AST 19 02/01/2019   NA 140 02/01/2019   K 3.7 02/01/2019   CL 106 02/01/2019   CREATININE 0.73 02/01/2019   BUN 12 02/01/2019   CO2 30 02/01/2019   TSH 0.65 02/01/2019    Lab Results  Component Value Date   TSH 0.65 02/01/2019   Lab Results  Component Value Date   WBC 3.0 (L) 02/01/2019   HGB 11.6 (L) 02/01/2019   HCT 35.6 (L) 02/01/2019   MCV 84.1 02/01/2019   PLT 277.0 02/01/2019   Lab Results  Component Value Date   NA 140 02/01/2019   K 3.7 02/01/2019   CO2 30 02/01/2019   GLUCOSE 96 02/01/2019   BUN 12 02/01/2019   CREATININE 0.73 02/01/2019   BILITOT 0.4 02/01/2019   ALKPHOS 78 02/01/2019   AST 19 02/01/2019   ALT 21 02/01/2019   PROT 6.3 02/01/2019   ALBUMIN 4.2 02/01/2019   CALCIUM 9.7 02/01/2019   GFR 80.85 02/01/2019   Lab Results  Component Value Date   CHOL 174 02/01/2019   Lab Results  Component Value Date   HDL 66.30 02/01/2019   Lab Results  Component Value Date   LDLCALC 93 02/01/2019   Lab Results  Component Value Date   TRIG 77.0 02/01/2019   Lab Results  Component Value Date   CHOLHDL 3 02/01/2019   No results found for: HGBA1C     Assessment & Plan:   Problem List Items Addressed This Visit      Active Problems   Osteoporosis    Osteoporosis evident in spine, hips remain in the osteopenia category.  Name: Alicia Valencia, Alicia Valencia Patient ID: [id number] Birth Date: 03-Jan-1956 Height: 63.5 in. Gender: Female Exam Date: 03/15/2019 Weight: 143.6 lbs. Indications: Caucasian, Osteopenia, Postmenopausal  Fractures: Treatments: calcium w/ vit D, Multi-Vitamin, Omeprazole  ASSESSMENT:  The BMD measured at AP Spine L1-L4 is 0.889 g/cm2 with a T-score of -2.5. This patient is considered osteoporotic according to [facility]) criteria. The scan quality is good.  Site Region Measured Measured WHO Young Adult BMD Date       Age      Classification T-score AP Spine L1-L4 03/15/2019 61.8 Osteoporosis -2.5 0.889 g/cm2 AP Spine L1-L4 10/29/2016 59.4 Osteopenia -2.1 0.938 g/cm2  DualFemur Neck Left 03/15/2019 61.8 Osteopenia -1.6 0.809 g/cm2 DualFemur Neck Left 10/29/2016 59.4 Osteopenia -1.6 0.817 g/cm2  DualFemur Total Mean 03/15/2019 61.8 Normal -1.0 0.884 g/cm2 DualFemur Total Mean 10/29/2016 59.4 Normal -0.8 0.910 g/cm2  I advised the following: At a minimum,  recommend 800 IU of vitamin D and 1200mg  of Calcium per day. You can get this with a calcium-vitamin D supplement.  Once you have the above in place, I would start taking fosamax 70mg  once a week.  Administer first thing in the morning and >30 minutes before the first food, beverage (except plain water), or other medication of the day. Do not take with mineral water or with other beverages. Stay upright (not to lie down) for at least 30 minutes after taking medicine and until after first food of the day (to reduce irritation). Must be taken with 6 to 8 oz of plain water. The tablet should be swallowed whole; do not chew or suck.            I am having Alicia Valencia "CIndy" maintain her calcium citrate-vitamin D, Multivitamin Adults 50+, Multiple Vitamins-Minerals (PRESERVISION AREDS 2 PO), Omega-3 Fatty Acids (FISH OIL PO), betamethasone dipropionate, ferrous sulfate, and omeprazole.  No orders of the defined types were placed in this encounter.    Arnette Norris, MD

## 2019-03-15 NOTE — Assessment & Plan Note (Addendum)
Osteoporosis evident in spine, hips remain in the osteopenia category.  She does do weight bearing exercises.   Name: Alicia Valencia, Life Patient ID: [id number] Birth Date: [birth year] Height: 63.5 in. Gender: Female Exam Date: 03/15/2019 Weight: 143.6 lbs. Indications: Caucasian, Osteopenia, Postmenopausal Fractures: Treatments: calcium w/ vit D, Multi-Vitamin, Omeprazole  ASSESSMENT:  The BMD measured at AP Spine L1-L4 is 0.889 g/cm2 with a T-score of -2.5. This patient is considered osteoporotic according to [facility]) criteria. The scan quality is good.  Site Region Measured Measured WHO Young Adult BMD Date       Age      Classification T-score AP Spine L1-L4 03/15/2019 61.8 Osteoporosis -2.5 0.889 g/cm2 AP Spine L1-L4 10/29/2016 59.4 Osteopenia -2.1 0.938 g/cm2  DualFemur Neck Left 03/15/2019 61.8 Osteopenia -1.6 0.809 g/cm2 DualFemur Neck Left 10/29/2016 59.4 Osteopenia -1.6 0.817 g/cm2  DualFemur Total Mean 03/15/2019 61.8 Normal -1.0 0.884 g/cm2 DualFemur Total Mean 10/29/2016 59.4 Normal -0.8 0.910 g/cm2 She agrees to start Fosamax. I advised the following: At a minimum, recommend 800 IU of vitamin D and 1200mg  of Calcium per day. You can get this with a calcium-vitamin D supplement.  Once you have the above in place, I would start taking fosamax 70mg  once a week.  Administer first thing in the morning and >30 minutes before the first food, beverage (except plain water), or other medication of the day. Do not take with mineral water or with other beverages. Stay upright (not to lie down) for at least 30 minutes after taking medicine and until after first food of the day (to reduce irritation). Must be taken with 6 to 8 oz of plain water. The tablet should be swallowed whole; do not chew or suck.

## 2019-03-16 ENCOUNTER — Encounter: Payer: Self-pay | Admitting: Family Medicine

## 2019-03-16 ENCOUNTER — Ambulatory Visit (INDEPENDENT_AMBULATORY_CARE_PROVIDER_SITE_OTHER): Payer: BC Managed Care – PPO | Admitting: Family Medicine

## 2019-03-16 DIAGNOSIS — M810 Age-related osteoporosis without current pathological fracture: Secondary | ICD-10-CM

## 2019-03-16 MED ORDER — ALENDRONATE SODIUM 70 MG PO TABS: 70.0000 mg | ORAL_TABLET | ORAL | 6 refills | Status: DC

## 2019-03-16 MED ORDER — ALENDRONATE SODIUM 70 MG PO TABS: 70.0000 mg | ORAL_TABLET | ORAL | 11 refills | Status: DC

## 2019-03-16 NOTE — Addendum Note (Signed)
Addended by: Lucille Passy on: 03/16/2019 10:59 AM   Modules accepted: Orders

## 2019-03-17 DIAGNOSIS — D045 Carcinoma in situ of skin of trunk: Secondary | ICD-10-CM | POA: Diagnosis not present

## 2019-03-30 ENCOUNTER — Encounter: Payer: Self-pay | Admitting: Family Medicine

## 2019-03-30 ENCOUNTER — Other Ambulatory Visit: Payer: Self-pay | Admitting: Family Medicine

## 2019-03-30 MED ORDER — ALENDRONATE SODIUM 70 MG PO TABS: 70.0000 mg | ORAL_TABLET | ORAL | 6 refills | Status: DC

## 2019-05-13 ENCOUNTER — Encounter: Payer: Self-pay | Admitting: Family Medicine

## 2019-05-16 ENCOUNTER — Encounter: Payer: Self-pay | Admitting: Family Medicine

## 2019-05-18 ENCOUNTER — Other Ambulatory Visit: Payer: Self-pay

## 2019-05-18 MED ORDER — FERROUS GLUCONATE 324 (38 FE) MG PO TABS: 324.0000 mg | ORAL_TABLET | Freq: Every day | ORAL | 3 refills | Status: DC

## 2019-06-08 DIAGNOSIS — H52213 Irregular astigmatism, bilateral: Secondary | ICD-10-CM | POA: Diagnosis not present

## 2019-06-08 DIAGNOSIS — H179 Unspecified corneal scar and opacity: Secondary | ICD-10-CM | POA: Diagnosis not present

## 2019-06-09 DIAGNOSIS — D2261 Melanocytic nevi of right upper limb, including shoulder: Secondary | ICD-10-CM | POA: Diagnosis not present

## 2019-06-09 DIAGNOSIS — Z85828 Personal history of other malignant neoplasm of skin: Secondary | ICD-10-CM | POA: Diagnosis not present

## 2019-06-09 DIAGNOSIS — X32XXXA Exposure to sunlight, initial encounter: Secondary | ICD-10-CM | POA: Diagnosis not present

## 2019-06-09 DIAGNOSIS — D2262 Melanocytic nevi of left upper limb, including shoulder: Secondary | ICD-10-CM | POA: Diagnosis not present

## 2019-06-09 DIAGNOSIS — D2271 Melanocytic nevi of right lower limb, including hip: Secondary | ICD-10-CM | POA: Diagnosis not present

## 2019-06-09 DIAGNOSIS — L570 Actinic keratosis: Secondary | ICD-10-CM | POA: Diagnosis not present

## 2019-11-09 DIAGNOSIS — Z03818 Encounter for observation for suspected exposure to other biological agents ruled out: Secondary | ICD-10-CM | POA: Diagnosis not present

## 2019-11-09 DIAGNOSIS — Z20822 Contact with and (suspected) exposure to covid-19: Secondary | ICD-10-CM | POA: Diagnosis not present

## 2019-11-15 DIAGNOSIS — E041 Nontoxic single thyroid nodule: Secondary | ICD-10-CM | POA: Diagnosis not present

## 2019-11-22 ENCOUNTER — Encounter: Payer: BC Managed Care – PPO | Admitting: Nurse Practitioner

## 2019-11-23 ENCOUNTER — Other Ambulatory Visit: Payer: Self-pay

## 2019-11-23 ENCOUNTER — Encounter: Payer: Self-pay | Admitting: Nurse Practitioner

## 2019-11-23 ENCOUNTER — Ambulatory Visit: Payer: BC Managed Care – PPO | Admitting: Nurse Practitioner

## 2019-11-23 VITALS — BP 110/72 | HR 69 | Temp 98.0°F | Ht 64.0 in | Wt 141.0 lb

## 2019-11-23 DIAGNOSIS — M810 Age-related osteoporosis without current pathological fracture: Secondary | ICD-10-CM | POA: Diagnosis not present

## 2019-11-23 DIAGNOSIS — E041 Nontoxic single thyroid nodule: Secondary | ICD-10-CM | POA: Diagnosis not present

## 2019-11-23 DIAGNOSIS — Z0000 Encounter for general adult medical examination without abnormal findings: Secondary | ICD-10-CM

## 2019-11-23 NOTE — Patient Instructions (Addendum)
It was very nice to meet you today!  Continue with healthy lifestyle and skin care. Please check on the Caltrate with D dose.   Please follow up with labs 1 week before your CPE in November.

## 2019-11-23 NOTE — Progress Notes (Signed)
Established Patient Office Visit  Subjective:  Patient ID: Alicia Valencia, female    DOB: Dec 14, 1955  Age: 64 y.o. MRN: [id number]  CC:  Chief Complaint  Patient presents with  . Transitions Of Care    establish care    HPI Alicia Valencia is a 64 yo w thyroid nodules, osteoporosis, throat clearing hx who presents to transfer care from  Dr. Marjory Lies. She is due for annual labs.   Thyroid nodules-benign: Followed by Endocrinology and recently saw Dr. Gabriel Carina. She has normal TSH and does not take thyroid medication ,   Throat clearing: Hard to clear throat- last spring. Positive  H pylori and treated 10/20. No change. No burping.No HB, belching, food, pill or liquid dysphagia. No hoarseness. She does get PND. Has not used Claritin recently.  .  Osteoporosis: Fosamax last year Ca w Vit D.   Immunizations: Colonoscopy:UTD until 2024 Dexa: UTD done 2020 Pap Smear: Due 2022 Mammogram: due 03/2020 Vision: Macular degeneration - UTD   Past Medical History:  Diagnosis Date  . History of fainting spells of unknown cause     Past Surgical History:  Procedure Laterality Date  . BREAST BIOPSY Left    negative core  . CESAREAN SECTION    . THYROIDECTOMY      Family History  Problem Relation Age of Onset  . Heart disease Mother   . Hypertension Mother   . Diabetes Mother   . Diabetes Father   . Hypertension Sister   . Diabetes Maternal Aunt   . Diabetes Maternal Uncle   . Diabetes Maternal Grandmother   . Diabetes Maternal Grandfather   . Breast cancer Neg Hx     Social History   Socioeconomic History  . Marital status: Married    Spouse name: Not on file  . Number of children: Not on file  . Years of education: Not on file  . Highest education level: Not on file  Occupational History  . Not on file  Tobacco Use  . Smoking status: Former Research scientist (life sciences)  . Smokeless tobacco: Never Used  Substance and Sexual Activity  . Alcohol use: Yes  . Drug use: No  . Sexual activity:  Yes  Other Topics Concern  . Not on file  Social History Narrative   Retkred from real estate. Married lives w husband- 2 grown kids out of state -[employer]. For fun-garden and walks.    Social Determinants of Health   Financial Resource Strain:   . Difficulty of Paying Living Expenses: Not on file  Food Insecurity:   . Worried About Charity fundraiser in the Last Year: Not on file  . Ran Out of Food in the Last Year: Not on file  Transportation Needs:   . Lack of Transportation (Medical): Not on file  . Lack of Transportation (Non-Medical): Not on file  Physical Activity:   . Days of Exercise per Week: Not on file  . Minutes of Exercise per Session: Not on file  Stress:   . Feeling of Stress : Not on file  Social Connections:   . Frequency of Communication with Friends and Family: Not on file  . Frequency of Social Gatherings with Friends and Family: Not on file  . Attends Religious Services: Not on file  . Active Member of Clubs or Organizations: Not on file  . Attends Archivist Meetings: Not on file  . Marital Status: Not on file  Intimate Partner Violence:   . Fear of Current  or Ex-Partner: Not on file  . Emotionally Abused: Not on file  . Physically Abused: Not on file  . Sexually Abused: Not on file    Outpatient Medications Prior to Visit  Medication Sig Dispense Refill  . alendronate (FOSAMAX) 70 MG tablet Take 1 tablet (70 mg total) by mouth every 7 (seven) days for 90 doses. Take with a full glass of water on an empty stomach. 12 tablet 6  . betamethasone dipropionate 0.05 % cream APP EXT TO RASH BID PRN.    . calcium citrate-vitamin D (CITRACAL+D) 315-200 MG-UNIT tablet Take by mouth.    . ferrous gluconate (FERGON) 324 MG tablet Take 1 tablet (324 mg total) by mouth daily with breakfast. 90 tablet 3  . Multiple Vitamins-Minerals (MULTIVITAMIN ADULTS 50+) TABS Take 1 tablet by mouth daily.    . Multiple Vitamins-Minerals (PRESERVISION AREDS 2 PO) Take  2 tablets by mouth daily.    . Omega-3 Fatty Acids (FISH OIL PO) Take 1,500 mg by mouth daily.    . ferrous sulfate 325 (65 FE) MG EC tablet Take 1 tablet (325 mg total) by mouth daily with breakfast. 30 tablet 3  . omeprazole (PRILOSEC) 20 MG capsule Take 1 capsule (20 mg total) by mouth 2 (two) times daily before a meal for 14 days. 28 capsule 0   No facility-administered medications prior to visit.    No Known Allergies  ROS Review of Systems  Constitutional: Negative for chills and fever.  HENT: Positive for postnasal drip.        Throat clearing  Eyes: Negative.   Respiratory: Negative for cough and shortness of breath.   Cardiovascular: Negative for chest pain, palpitations and leg swelling.  Gastrointestinal: Negative for abdominal pain and nausea.  Endocrine: Negative for cold intolerance and heat intolerance.  Genitourinary: Negative.   Musculoskeletal: Negative.   Skin: Negative.   Allergic/Immunologic: Positive for environmental allergies.  Neurological: Negative.   Hematological: Negative.        Low iron- off and on iron supplements  over the years. chronically low WBC  Psychiatric/Behavioral: Negative.       Objective:    Physical Exam Vitals reviewed.  Constitutional:      Appearance: Normal appearance. She is normal weight.  HENT:     Head: Normocephalic.  Eyes:     Conjunctiva/sclera: Conjunctivae normal.     Pupils: Pupils are equal, round, and reactive to light.  Cardiovascular:     Rate and Rhythm: Normal rate and regular rhythm.     Pulses: Normal pulses.     Heart sounds: Normal heart sounds.  Pulmonary:     Effort: Pulmonary effort is normal.     Breath sounds: Normal breath sounds.  Abdominal:     Palpations: Abdomen is soft.     Tenderness: There is no abdominal tenderness.  Musculoskeletal:        General: Normal range of motion.     Cervical back: Normal range of motion.  Skin:    General: Skin is warm.  Neurological:     General:  No focal deficit present.     Mental Status: She is alert and oriented to person, place, and time.  Psychiatric:        Mood and Affect: Mood normal.        Behavior: Behavior normal.     BP 110/72 (BP Location: Left Arm, Patient Position: Sitting, Cuff Size: Normal)   Pulse 69   Temp 98 F (36.7 C) (Oral)  Ht 5\' 4"  (1.626 m)   Wt 141 lb (64 kg)   SpO2 98%   BMI 24.20 kg/m  Wt Readings from Last 3 Encounters:  11/23/19 141 lb (64 kg)  03/15/19 141 lb (64 kg)  02/01/19 141 lb 12.8 oz (64.3 kg)     Health Maintenance Due  Topic Date Due  . INFLUENZA VACCINE  11/05/2019    There are no preventive care reminders to display for this patient.  Lab Results  Component Value Date   TSH 0.65 02/01/2019   Lab Results  Component Value Date   WBC 3.0 (L) 02/01/2019   HGB 11.6 (L) 02/01/2019   HCT 35.6 (L) 02/01/2019   MCV 84.1 02/01/2019   PLT 277.0 02/01/2019   Lab Results  Component Value Date   NA 140 02/01/2019   K 3.7 02/01/2019   CO2 30 02/01/2019   GLUCOSE 96 02/01/2019   BUN 12 02/01/2019   CREATININE 0.73 02/01/2019   BILITOT 0.4 02/01/2019   ALKPHOS 78 02/01/2019   AST 19 02/01/2019   ALT 21 02/01/2019   PROT 6.3 02/01/2019   ALBUMIN 4.2 02/01/2019   CALCIUM 9.7 02/01/2019   GFR 80.85 02/01/2019   Lab Results  Component Value Date   CHOL 174 02/01/2019   Lab Results  Component Value Date   HDL 66.30 02/01/2019   Lab Results  Component Value Date   LDLCALC 93 02/01/2019   Lab Results  Component Value Date   TRIG 77.0 02/01/2019   Lab Results  Component Value Date   CHOLHDL 3 02/01/2019   No results found for: HGBA1C    Assessment & Plan:   Problem List Items Addressed This Visit      Endocrine   Thyroid nodule     Musculoskeletal and Integument   Age related osteoporosis     Other   Encounter for medical examination to establish care - Primary      No orders of the defined types were placed in this encounter.   Continue  with healthy lifestyle and skin care. Please check on the Caltrate with D dose.   She will try Claritin for PND and throat clearing. If no improvement, ENT referral.   Please follow up with labs 1 week before your CPE in November.   Follow-up: No follow-ups on file.  This visit occurred during the SARS-CoV-2 public health emergency.  Safety protocols were in place, including screening questions prior to the visit, additional usage of staff PPE, and extensive cleaning of exam room while observing appropriate contact time as indicated for disinfecting solutions.    Denice Paradise, NP

## 2019-11-28 DIAGNOSIS — E041 Nontoxic single thyroid nodule: Secondary | ICD-10-CM | POA: Diagnosis not present

## 2019-12-04 ENCOUNTER — Encounter: Payer: Self-pay | Admitting: Nurse Practitioner

## 2020-01-15 ENCOUNTER — Encounter: Payer: BC Managed Care – PPO | Admitting: Nurse Practitioner

## 2020-01-22 ENCOUNTER — Other Ambulatory Visit (INDEPENDENT_AMBULATORY_CARE_PROVIDER_SITE_OTHER): Payer: BC Managed Care – PPO

## 2020-01-22 ENCOUNTER — Other Ambulatory Visit: Payer: Self-pay

## 2020-01-22 DIAGNOSIS — Z0000 Encounter for general adult medical examination without abnormal findings: Secondary | ICD-10-CM

## 2020-01-22 LAB — CBC WITH DIFFERENTIAL/PLATELET
Basophils Absolute: 0 10*3/uL (ref 0.0–0.1)
Basophils Relative: 1.1 % (ref 0.0–3.0)
Eosinophils Absolute: 0.1 10*3/uL (ref 0.0–0.7)
Eosinophils Relative: 3.3 % (ref 0.0–5.0)
HCT: 41.7 % (ref 36.0–46.0)
Hemoglobin: 14.3 g/dL (ref 12.0–15.0)
Lymphocytes Relative: 33.8 % (ref 12.0–46.0)
Lymphs Abs: 1.3 10*3/uL (ref 0.7–4.0)
MCHC: 34.2 g/dL (ref 30.0–36.0)
MCV: 93.9 fl (ref 78.0–100.0)
Monocytes Absolute: 0.3 10*3/uL (ref 0.1–1.0)
Monocytes Relative: 7.8 % (ref 3.0–12.0)
Neutro Abs: 2 10*3/uL (ref 1.4–7.7)
Neutrophils Relative %: 54 % (ref 43.0–77.0)
Platelets: 232 10*3/uL (ref 150.0–400.0)
RBC: 4.44 Mil/uL (ref 3.87–5.11)
RDW: 13.2 % (ref 11.5–15.5)
WBC: 3.8 10*3/uL — ABNORMAL LOW (ref 4.0–10.5)

## 2020-01-22 LAB — COMPREHENSIVE METABOLIC PANEL
ALT: 25 U/L (ref 0–35)
AST: 23 U/L (ref 0–37)
Albumin: 4.3 g/dL (ref 3.5–5.2)
Alkaline Phosphatase: 70 U/L (ref 39–117)
BUN: 13 mg/dL (ref 6–23)
CO2: 27 mEq/L (ref 19–32)
Calcium: 9.3 mg/dL (ref 8.4–10.5)
Chloride: 104 mEq/L (ref 96–112)
Creatinine, Ser: 0.71 mg/dL (ref 0.40–1.20)
GFR: 90.83 mL/min (ref >60.00)
Glucose, Bld: 93 mg/dL (ref 70–99)
Potassium: 4 mEq/L (ref 3.5–5.1)
Sodium: 139 mEq/L (ref 135–145)
Total Bilirubin: 0.4 mg/dL (ref 0.2–1.2)
Total Protein: 6.6 g/dL (ref 6.0–8.3)

## 2020-01-22 LAB — LIPID PANEL
Cholesterol: 195 mg/dL (ref 0–200)
HDL: 78.9 mg/dL (ref >39.00)
LDL Cholesterol: 97 mg/dL (ref 0–99)
NonHDL: 115.75
Total CHOL/HDL Ratio: 2
Triglycerides: 93 mg/dL (ref 0.0–149.0)
VLDL: 18.6 mg/dL (ref 0.0–40.0)

## 2020-01-22 LAB — TSH: TSH: 1.28 u[IU]/mL (ref 0.35–4.50)

## 2020-01-22 LAB — HEMOGLOBIN A1C: Hgb A1c MFr Bld: 5.5 % (ref 4.6–6.5)

## 2020-01-22 LAB — VITAMIN D 25 HYDROXY (VIT D DEFICIENCY, FRACTURES): VITD: 43.44 ng/mL (ref 30.00–100.00)

## 2020-01-22 LAB — B12 AND FOLATE PANEL
Folate: >24.8 ng/mL (ref >5.9)
Vitamin B-12: 865 pg/mL (ref 211–911)

## 2020-01-22 NOTE — Addendum Note (Signed)
Addended by: Tor Netters I on: 01/22/2020 09:55 AM   Modules accepted: Orders

## 2020-01-23 LAB — HIV ANTIBODY (ROUTINE TESTING W REFLEX): HIV 1&2 Ab, 4th Generation: NONREACTIVE

## 2020-01-23 LAB — HEPATITIS C ANTIBODY
Hepatitis C Ab: NONREACTIVE
SIGNAL TO CUT-OFF: 0.01 (ref <1.00)

## 2020-01-31 ENCOUNTER — Other Ambulatory Visit: Payer: Self-pay | Admitting: Nurse Practitioner

## 2020-01-31 DIAGNOSIS — Z1231 Encounter for screening mammogram for malignant neoplasm of breast: Secondary | ICD-10-CM

## 2020-02-08 ENCOUNTER — Encounter: Payer: BC Managed Care – PPO | Admitting: Nurse Practitioner

## 2020-02-12 ENCOUNTER — Ambulatory Visit (INDEPENDENT_AMBULATORY_CARE_PROVIDER_SITE_OTHER): Payer: BC Managed Care – PPO | Admitting: Nurse Practitioner

## 2020-02-12 ENCOUNTER — Other Ambulatory Visit: Payer: Self-pay

## 2020-02-12 ENCOUNTER — Encounter: Payer: Self-pay | Admitting: Nurse Practitioner

## 2020-02-12 VITALS — BP 118/78 | HR 69 | Temp 98.1°F | Ht 64.0 in | Wt 140.0 lb

## 2020-02-12 DIAGNOSIS — M810 Age-related osteoporosis without current pathological fracture: Secondary | ICD-10-CM

## 2020-02-12 DIAGNOSIS — D500 Iron deficiency anemia secondary to blood loss (chronic): Secondary | ICD-10-CM

## 2020-02-12 DIAGNOSIS — R6889 Other general symptoms and signs: Secondary | ICD-10-CM | POA: Diagnosis not present

## 2020-02-12 DIAGNOSIS — H6123 Impacted cerumen, bilateral: Secondary | ICD-10-CM | POA: Insufficient documentation

## 2020-02-12 DIAGNOSIS — D72819 Decreased white blood cell count, unspecified: Secondary | ICD-10-CM

## 2020-02-12 DIAGNOSIS — N6325 Unspecified lump in the left breast, overlapping quadrants: Secondary | ICD-10-CM

## 2020-02-12 DIAGNOSIS — Z0000 Encounter for general adult medical examination without abnormal findings: Secondary | ICD-10-CM | POA: Insufficient documentation

## 2020-02-12 DIAGNOSIS — R0982 Postnasal drip: Secondary | ICD-10-CM | POA: Insufficient documentation

## 2020-02-12 DIAGNOSIS — R0989 Other specified symptoms and signs involving the circulatory and respiratory systems: Secondary | ICD-10-CM | POA: Insufficient documentation

## 2020-02-12 LAB — IBC + FERRITIN
Ferritin: 29.4 ng/mL (ref 10.0–291.0)
Iron: 128 ug/dL (ref 42–145)
Saturation Ratios: 37.8 % (ref 20.0–50.0)
Transferrin: 242 mg/dL (ref 212.0–360.0)

## 2020-02-12 MED ORDER — ALENDRONATE SODIUM 70 MG PO TABS: 70.0000 mg | ORAL_TABLET | ORAL | 6 refills | Status: AC

## 2020-02-12 MED ORDER — AZELASTINE HCL 0.1 % NA SOLN: 1.0000 | Freq: Two times a day (BID) | NASAL | 0 refills | Status: DC

## 2020-02-12 NOTE — Progress Notes (Signed)
Established Patient Office Visit  Subjective:  Patient ID: Alicia Valencia, female    DOB: 1956-02-12  Age: 64 y.o. MRN: [id number]  CC:  Chief Complaint  Patient presents with   Annual Exam    CPE no pap    HPI Alicia Valencia is a 18 who presents for a complete physical exam without pap test. She is doing well. We discussed IDA, throat clearing, routine lab results.  IDA chronic and intermittent: She is on oral iron ferrous gluconate 324 daily.  Hgb improved from 11.6 to 14. She donates blood every 8 weeks for many years and continues to do so.     Throat clearing: Onset spring 2020 and feels like PND is the cause. Throat clearing is more constant. She thought allergy related, but  she has not been outside in a while. She has drainage. No sinus congestion or pressure. She had a positive H pylori in 01/2019 and was treated. No GERD sx.  Denies typical allergic rhinitis sx. No hoarseness, throat pain,  dysphagia, Hb or reflux, or cough.   Osteoporosis: Onset Fosamax began in Jan 2021. Always careful to take as directed and has noted no GI problems.  Immunizations:She reports UTD tetanus, Shingles - needs Covid Booster scheduled Fri  Diet: Healthy diet Exercise: regular- treadmill walk and weights Colonoscopy:UTD until 2024 Dexa: UTD done 2020 Pap Smear: Due 2022 Mammogram: due 03/2020 Vision: Macular degeneration - UTD Dentist: UTD  Past Medical History:  Diagnosis Date   Basal cell carcinoma    Chicken pox    History of fainting spells of unknown cause     Past Surgical History:  Procedure Laterality Date   BREAST BIOPSY Left    negative core   CESAREAN SECTION     THYROIDECTOMY      Family History  Problem Relation Age of Onset   Heart disease Mother    Hypertension Mother    Diabetes Mother    Diabetes Father    Hearing loss Father    Hypertension Sister    Diabetes Maternal Aunt    Diabetes Maternal Uncle    Diabetes Maternal Grandmother     Diabetes Maternal Grandfather    Breast cancer Neg Hx     Social History   Socioeconomic History   Marital status: Married    Spouse name: Not on file   Number of children: Not on file   Years of education: Not on file   Highest education level: Not on file  Occupational History   Not on file  Tobacco Use   Smoking status: Former Smoker   Smokeless tobacco: Never Used  Scientific laboratory technician Use: Never used  Substance and Sexual Activity   Alcohol use: Yes   Drug use: No   Sexual activity: Yes  Other Topics Concern   Not on file  Social History Narrative   Retkred from real estate. Married lives w husband- 2 grown kids out of state -[employer]. For fun-garden and walks.    Social Determinants of Health   Financial Resource Strain:    Difficulty of Paying Living Expenses: Not on file  Food Insecurity:    Worried About Charity fundraiser in the Last Year: Not on file   [employer] of Food in the Last Year: Not on file  Transportation Needs:    Lack of Transportation (Medical): Not on file   Lack of Transportation (Non-Medical): Not on file  Physical Activity:    Days of Exercise  per Week: Not on file   Minutes of Exercise per Session: Not on file  Stress:    Feeling of Stress : Not on file  Social Connections:    Frequency of Communication with Friends and Family: Not on file   Frequency of Social Gatherings with Friends and Family: Not on file   Attends Religious Services: Not on file   Active Member of Clubs or Organizations: Not on file   Attends Archivist Meetings: Not on file   Marital Status: Not on file  Intimate Partner Violence:    Fear of Current or Ex-Partner: Not on file   Emotionally Abused: Not on file   Physically Abused: Not on file   Sexually Abused: Not on file    Outpatient Medications Prior to Visit  Medication Sig Dispense Refill   calcium citrate-vitamin D (CITRACAL+D) 315-200 MG-UNIT tablet Take  by mouth.     ferrous gluconate (FERGON) 324 MG tablet Take 1 tablet (324 mg total) by mouth daily with breakfast. 90 tablet 3   Multiple Vitamins-Minerals (PRESERVISION AREDS 2 PO) Take 2 tablets by mouth daily.     Omega-3 Fatty Acids (FISH OIL PO) Take 1,500 mg by mouth daily.     alendronate (FOSAMAX) 70 MG tablet Take 1 tablet (70 mg total) by mouth every 7 (seven) days for 90 doses. Take with a full glass of water on an empty stomach. 12 tablet 6   Multiple Vitamins-Minerals (MULTIVITAMIN ADULTS 50+) TABS Take 1 tablet by mouth daily.     betamethasone dipropionate 0.05 % cream APP EXT TO RASH BID PRN.     No facility-administered medications prior to visit.    No Known Allergies  Review of Systems  Constitutional: Negative.   HENT: Positive for postnasal drip.   Eyes: Negative.   Respiratory: Negative.   Cardiovascular: Negative.   Gastrointestinal: Negative.   Endocrine: Negative.   Genitourinary: Negative.   Musculoskeletal:       Arthritis base of thumbs and other fingers- mild.   Allergic/Immunologic: Negative for environmental allergies and food allergies.  Neurological: Negative.   Hematological: Negative.   Psychiatric/Behavioral:       No concerns about depression/anxiety.       Objective:    Physical Exam Vitals reviewed. Exam conducted with a chaperone present.  Constitutional:      Appearance: She is normal weight.  HENT:     Head: Normocephalic and atraumatic.     Right Ear: There is impacted cerumen.     Left Ear: There is impacted cerumen.  Eyes:     Conjunctiva/sclera: Conjunctivae normal.     Pupils: Pupils are equal, round, and reactive to light.  Cardiovascular:     Rate and Rhythm: Normal rate and regular rhythm.     Pulses: Normal pulses.     Heart sounds: Normal heart sounds.  Pulmonary:     Effort: Pulmonary effort is normal.     Breath sounds: Normal breath sounds.  Chest:     Breasts:        Right: Normal.        Left: Mass  present. No skin change or tenderness.    Abdominal:     General: Abdomen is flat.     Tenderness: There is no abdominal tenderness.  Musculoskeletal:        General: Normal range of motion.     Cervical back: Normal range of motion.  Lymphadenopathy:     Upper Body:  Right upper body: No supraclavicular, axillary or pectoral adenopathy.     Left upper body: No supraclavicular, axillary or pectoral adenopathy.  Skin:    General: Skin is warm and dry.  Neurological:     General: No focal deficit present.     Mental Status: She is alert and oriented to person, place, and time.  Psychiatric:        Mood and Affect: Mood normal.        Behavior: Behavior normal.     BP 118/78 (BP Location: Left Arm, Patient Position: Sitting, Cuff Size: Normal)    Pulse 69    Temp 98.1 F (36.7 C) (Oral)    Ht 5\' 4"  (1.626 m)    Wt 140 lb (63.5 kg)    SpO2 97%    BMI 24.03 kg/m  Wt Readings from Last 3 Encounters:  02/12/20 140 lb (63.5 kg)  11/23/19 141 lb (64 kg)  03/15/19 141 lb (64 kg)   Pulse Readings from Last 3 Encounters:  02/12/20 69  11/23/19 69  02/01/19 89    BP Readings from Last 3 Encounters:  02/12/20 118/78  11/23/19 110/72  02/01/19 110/80    Lab Results  Component Value Date   CHOL 195 01/22/2020   HDL 78.90 01/22/2020   LDLCALC 97 01/22/2020   TRIG 93.0 01/22/2020   CHOLHDL 2 01/22/2020      There are no preventive care reminders to display for this patient.  There are no preventive care reminders to display for this patient.  Lab Results  Component Value Date   TSH 1.28 01/22/2020   Lab Results  Component Value Date   WBC 3.8 (L) 01/22/2020   HGB 14.3 01/22/2020   HCT 41.7 01/22/2020   MCV 93.9 01/22/2020   PLT 232.0 01/22/2020   Lab Results  Component Value Date   NA 139 01/22/2020   K 4.0 01/22/2020   CO2 27 01/22/2020   GLUCOSE 93 01/22/2020   BUN 13 01/22/2020   CREATININE 0.71 01/22/2020   BILITOT 0.4 01/22/2020   ALKPHOS 70  01/22/2020   AST 23 01/22/2020   ALT 25 01/22/2020   PROT 6.6 01/22/2020   ALBUMIN 4.3 01/22/2020   CALCIUM 9.3 01/22/2020   GFR 90.83 01/22/2020   Lab Results  Component Value Date   CHOL 195 01/22/2020   Lab Results  Component Value Date   HDL 78.90 01/22/2020   Lab Results  Component Value Date   LDLCALC 97 01/22/2020   Lab Results  Component Value Date   TRIG 93.0 01/22/2020   Lab Results  Component Value Date   CHOLHDL 2 01/22/2020   Lab Results  Component Value Date   HGBA1C 5.5 01/22/2020      Assessment & Plan:   Problem List Items Addressed This Visit      Nervous and Auditory   Bilateral impacted cerumen     Musculoskeletal and Integument   Age related osteoporosis   Relevant Medications   alendronate (FOSAMAX) 70 MG tablet     Other   Iron deficiency anemia due to chronic blood loss   Relevant Orders   IBC + Ferritin   Leukopenia   Relevant Orders   Ambulatory referral to Hematology   Chronic throat clearing   Relevant Orders   Ambulatory referral to ENT   PND (post-nasal drip)   Encounter for well woman exam without gynecological exam - Primary    Other Visit Diagnoses    Breast lump on left  side at 9 o'clock position       Relevant Orders   MM DIAG BREAST TOMO BILATERAL   US BREAST LTD UNI LEFT INC AXILLA      Meds ordered this encounter  Medications   alendronate (FOSAMAX) 70 MG tablet    Sig: Take 1 tablet (70 mg total) by mouth every 7 (seven) days for 90 doses. Take with a full glass of water on an empty stomach.    Dispense:  12 tablet    Refill:  6    Order Specific Question:   Supervising Provider    Answer:   Einar Pheasant [phone]   azelastine (ASTELIN) 0.1 % nasal spray    Sig: Place 1 spray into both nostrils 2 (two) times daily. Use in each nostril as directed    Dispense:  9 mL    Refill:  0    Order Specific Question:   Supervising Provider    Answer:   Einar Pheasant [[id number]]   This 64 yo feels well and  we discussed throat clearing daily progressive since last year.Onset before  Fosamax was started. Astelin ordered for PND.Referral to ENT.    No GERD sx .She was advised about how donating  Blood every 8 weeks can cause low iron if not supplemented. Hgb is improved on oral iron therapy. Iron panel added to labs.   Breast exam shows a nodule left breast- may be from past clip site- records at [facility]. I changed the screening mammogram  to diagnostic mammogram with left [place].  She will follow with her GYN regarding pap testing and reports UTD.   Patient advised:   Shingles vaccine with nurse appointment. Please find out when you had your tetanus vaccine.   Ear wax drops Debrox and use as directed for 1 week and the come in for nurse visit to irrigate the ears.   For post nasal drainage- Astelin nasal spray referral to ENT.  I refilled the Fosamax. Continue with calcium and Vit d.   Referral to Hematology for low WBC, and anemia. Please refrain from donating blood until your iron stores are well supplements and then you will need to take chronic iron  to replace.  Anticipatory guidance: Patient counseled regarding regular dental exams q6 months, eye exams yearly-  limiting alcohol to 1 beverages per day.  Continue good  regular exercise and diet rich and fruits and vegetables to reduce risk of heart attack and stroke.   Follow-up: Return in about 3 months (around 05/14/2020).   This visit occurred during the SARS-CoV-2 public health emergency.  Safety protocols were in place, including screening questions prior to the visit, additional usage of staff PPE, and extensive cleaning of exam room while observing appropriate contact time as indicated for disinfecting solutions.   Denice Paradise, NP

## 2020-02-12 NOTE — Patient Instructions (Addendum)
Shingles vaccine with nurse appointment. Please find out when you had your tetanus vaccine.   Ear wax drops Debrox and use as directed for 1 week and the come in for nurse visit to irrigate the ears.   For post nasal drainage- Astelin nasal spray referral to ENT.  I refilled the Fosamax. Continue with calcium and Vit d.   Referral to Hematology for low WBC, and anemia. Please refrain from donating blood until your iron stores are well supplements and then you will need to take chronic iron  to replace.  Please call and schedule your 3D mammogram as discussed.  [facility]  [address] [place], [place] [phone]  Anticipatory guidance: Patient counseled regarding regular dental exams q6 months, eye exams yearly-  limiting alcohol to 1 beverages per day.  Continue good  regular exercise and diet rich and fruits and vegetables to reduce risk of heart attack and stroke.     Osteoporosis  Osteoporosis is thinning and loss of density in your bones. Osteoporosis makes bones more brittle and fragile and more likely to break (fracture). Over time, osteoporosis can cause your bones to become so weak that they fracture after a minor fall. Bones in the hip, wrist, and spine are most likely to fracture due to osteoporosis. What are the causes? The exact cause of this condition is not known. What increases the risk? You may be at greater risk for osteoporosis if you:  Have a family history of the condition.  Have poor nutrition.  Use steroid medicines, such as prednisone.  Are female.  Are age 62 or older.  Smoke or have a history of smoking.  Are not physically active (are sedentary).  Are white (Caucasian) or of Asian descent.  Have a small body frame.  Take certain medicines, such as antiseizure medicines. What are the signs or symptoms? A fracture might be the first sign of osteoporosis, especially if the fracture results from a fall or  injury that usually would not cause a bone to break. Other signs and symptoms include:  Pain in the neck or low back.  Stooped posture.  Loss of height. How is this diagnosed? This condition may be diagnosed based on:  Your medical history.  A physical exam.  A bone mineral density test, also called a DXA or DEXA test (dual-energy X-ray absorptiometry test). This test uses X-rays to measure the amount of minerals in your bones. How is this treated? The goal of treatment is to strengthen your bones and lower your risk for a fracture. Treatment may involve:  Making lifestyle changes, such as: ? Including foods with more calcium and vitamin D in your diet. ? Doing weight-bearing and muscle-strengthening exercises. ? Stopping tobacco use. ? Limiting alcohol intake.  Taking medicine to slow the process of bone loss or to increase bone density.  Taking daily supplements of calcium and vitamin D.  Taking hormone replacement medicines, such as estrogen for women and testosterone for men.  Monitoring your levels of calcium and vitamin D. Follow these instructions at home:  Activity  Exercise as told by your health care provider. Ask your health care provider what exercises and activities are safe for you. You should do: ? Exercises that make you work against gravity (weight-bearing exercises), such as tai chi, yoga, or walking. ? Exercises to strengthen muscles, such as lifting weights. Lifestyle  Limit alcohol intake to no more than 1 drink a day for nonpregnant women and 2 drinks a day for  men. One drink equals 12 oz of beer, 5 oz of wine, or 1 oz of hard liquor.  Do not use any products that contain nicotine or tobacco, such as cigarettes and e-cigarettes. If you need help quitting, ask your health care provider. Preventing falls  Use devices to help you move around (mobility aids) as needed, such as canes, walkers, scooters, or crutches.  Keep rooms well-lit and  clutter-free.  Remove tripping hazards from walkways, including cords and throw rugs.  Install grab bars in bathrooms and safety rails on stairs.  Use rubber mats in the bathroom and other areas that are often wet or slippery.  Wear closed-toe shoes that fit well and support your feet. Wear shoes that have rubber soles or low heels.  Review your medicines with your health care provider. Some medicines can cause dizziness or changes in blood pressure, which can increase your risk of falling. General instructions  Include calcium and vitamin D in your diet. Calcium is important for bone health, and vitamin D helps your body to absorb calcium. Good sources of calcium and vitamin D include: ? Certain fatty fish, such as salmon and tuna. ? Products that have calcium and vitamin D added to them (fortified products), such as fortified cereals. ? Egg yolks. ? Cheese. ? Liver.  Take over-the-counter and prescription medicines only as told by your health care provider.  Keep all follow-up visits as told by your health care provider. This is important. Contact a health care provider if:  You have never been screened for osteoporosis and you are: ? A woman who is age 62 or older. ? A man who is age 62 or older. Get help right away if:  You fall or injure yourself. Summary  Osteoporosis is thinning and loss of density in your bones. This makes bones more brittle and fragile and more likely to break (fracture),even with minor falls.  The goal of treatment is to strengthen your bones and reduce your risk for a fracture.  Include calcium and vitamin D in your diet. Calcium is important for bone health, and vitamin D helps your body to absorb calcium.  Talk with your health care provider about screening for osteoporosis if you are a woman who is age 62 or older, or a man who is age 62 or older. This information is not intended to replace advice given to you by your health care provider. Make  sure you discuss any questions you have with your health care provider. Document Revised: 03/05/2017 Document Reviewed: 01/15/2017 Elsevier Patient Education  [employer].  Postnasal Drip Postnasal drip is the feeling of mucus going down the back of your throat. Mucus is a slimy substance that moistens and cleans your nose and throat, as well as the air pockets in face bones near your forehead and cheeks (sinuses). Small amounts of mucus pass from your nose and sinuses down the back of your throat all the time. This is normal. When you produce too much mucus or the mucus gets too thick, you can feel it. Some common causes of postnasal drip include:  Having more mucus because of: ? A cold or the flu. ? Allergies. ? Cold air. ? Certain medicines.  Having more mucus that is thicker because of: ? A sinus or nasal infection. ? Dry air. ? A food allergy. Follow these instructions at home: Relieving discomfort   Gargle with a salt-water mixture 3-4 times a day or as needed. To make a salt-water mixture, completely  dissolve -1 tsp of salt in 1 cup of warm water.  If the air in your home is dry, use a humidifier to add moisture to the air.  Use a saline spray or container (neti pot) to flush out the nose (nasal irrigation). These methods can help clear away mucus and keep the nasal passages moist. General instructions  Take over-the-counter and prescription medicines only as told by your health care provider.  Follow instructions from your health care provider about eating or drinking restrictions. You may need to avoid caffeine.  Avoid things that you know you are allergic to (allergens), like dust, mold, pollen, pets, or certain foods.  Drink enough fluid to keep your urine pale yellow.  Keep all follow-up visits as told by your health care provider. This is important. Contact a health care provider if:  You have a fever.  You have a sore throat.  You have difficulty  swallowing.  You have headache.  You have sinus pain.  You have a cough that does not go away.  The mucus from your nose becomes thick and is green or yellow in color.  You have cold or flu symptoms that last more than 10 days. Summary  Postnasal drip is the feeling of mucus going down the back of your throat.  If your health care provider approves, use nasal irrigation or a nasal spray 2?4 times a day.  Avoid things that you know you are allergic to (allergens), like dust, mold, pollen, pets, or certain foods. This information is not intended to replace advice given to you by your health care provider. Make sure you discuss any questions you have with your health care provider. Document Revised: 07/15/2018 Document Reviewed: 07/06/2016 Elsevier Patient Education  2020 Elsevier Inc.   Preventive Care 71-69 Years Old, Female Preventive care refers to visits with your health care provider and lifestyle choices that can promote health and wellness. This includes:  A yearly physical exam. This may also be called an annual well check.  Regular dental visits and eye exams.  Immunizations.  Screening for certain conditions.  Healthy lifestyle choices, such as eating a healthy diet, getting regular exercise, not using drugs or products that contain nicotine and tobacco, and limiting alcohol use. What can I expect for my preventive care visit? Physical exam Your health care provider will check your:  Height and weight. This may be used to calculate body mass index (BMI), which tells if you are at a healthy weight.  Heart rate and blood pressure.  Skin for abnormal spots. Counseling Your health care provider may ask you questions about your:  Alcohol, tobacco, and drug use.  Emotional well-being.  Home and relationship well-being.  Sexual activity.  Eating habits.  Work and work Statistician.  Method of birth control.  Menstrual cycle.  Pregnancy history. What  immunizations do I need?  Influenza (flu) vaccine  This is recommended every year. Tetanus, diphtheria, and pertussis (Tdap) vaccine  You may need a Td booster every 10 years. Varicella (chickenpox) vaccine  You may need this if you have not been vaccinated. Zoster (shingles) vaccine  You may need this after age 23. Measles, mumps, and rubella (MMR) vaccine  You may need at least one dose of MMR if you were born in 1957 or later. You may also need a second dose. Pneumococcal conjugate (PCV13) vaccine  You may need this if you have certain conditions and were not previously vaccinated. Pneumococcal polysaccharide (PPSV23) vaccine  You may need  one or two doses if you smoke cigarettes or if you have certain conditions. Meningococcal conjugate (MenACWY) vaccine  You may need this if you have certain conditions. Hepatitis A vaccine  You may need this if you have certain conditions or if you travel or work in places where you may be exposed to hepatitis A. Hepatitis B vaccine  You may need this if you have certain conditions or if you travel or work in places where you may be exposed to hepatitis B. Haemophilus influenzae type b (Hib) vaccine  You may need this if you have certain conditions. Human papillomavirus (HPV) vaccine  If recommended by your health care provider, you may need three doses over 6 months. You may receive vaccines as individual doses or as more than one vaccine together in one shot (combination vaccines). Talk with your health care provider about the risks and benefits of combination vaccines. What tests do I need? Blood tests  Lipid and cholesterol levels. These may be checked every 5 years, or more frequently if you are over 100 years old.  Hepatitis C test.  Hepatitis B test. Screening  Lung cancer screening. You may have this screening every year starting at age 2 if you have a 30-pack-year history of smoking and currently smoke or have quit  within the past 15 years.  Colorectal cancer screening. All adults should have this screening starting at age 66 and continuing until age 43. Your health care provider may recommend screening at age 40 if you are at increased risk. You will have tests every 1-10 years, depending on your results and the type of screening test.  Diabetes screening. This is done by checking your blood sugar (glucose) after you have not eaten for a while (fasting). You may have this done every 1-3 years.  Mammogram. This may be done every 1-2 years. Talk with your health care provider about when you should start having regular mammograms. This may depend on whether you have a family history of breast cancer.  BRCA-related cancer screening. This may be done if you have a family history of breast, ovarian, tubal, or peritoneal cancers.  Pelvic exam and Pap test. This may be done every 3 years starting at age 22. Starting at age 19, this may be done every 5 years if you have a Pap test in combination with an HPV test. Other tests  Sexually transmitted disease (STD) testing.  Bone density scan. This is done to screen for osteoporosis. You may have this scan if you are at high risk for osteoporosis. Follow these instructions at home: Eating and drinking  Eat a diet that includes fresh fruits and vegetables, whole grains, lean protein, and low-fat dairy.  Take vitamin and mineral supplements as recommended by your health care provider.  Do not drink alcohol if: ? Your health care provider tells you not to drink. ? You are pregnant, may be pregnant, or are planning to become pregnant.  If you drink alcohol: ? Limit how much you have to 0-1 drink a day. ? Be aware of how much alcohol is in your drink. In the [place] one drink equals one 12 oz bottle of beer (355 mL), one 5 oz glass of wine (148 mL), or one 1 oz glass of hard liquor (44 mL). Lifestyle  Take daily care of your teeth and gums.  Stay active.  Exercise for at least 30 minutes on 5 or more days each week.  Do not use any products that contain nicotine  or tobacco, such as cigarettes, e-cigarettes, and chewing tobacco. If you need help quitting, ask your health care provider.  If you are sexually active, practice safe sex. Use a condom or other form of birth control (contraception) in order to prevent pregnancy and STIs (sexually transmitted infections).  If told by your health care provider, take low-dose aspirin daily starting at age 39. What's next?  Visit your health care provider once a year for a well check visit.  Ask your health care provider how often you should have your eyes and teeth checked.  Stay up to date on all vaccines. This information is not intended to replace advice given to you by your health care provider. Make sure you discuss any questions you have with your health care provider. Document Revised: 12/02/2017 Document Reviewed: 12/02/2017 Elsevier Patient Education  2020 [employer].

## 2020-02-13 ENCOUNTER — Encounter: Payer: Self-pay | Admitting: Nurse Practitioner

## 2020-02-16 ENCOUNTER — Inpatient Hospital Stay: Payer: BC Managed Care – PPO

## 2020-02-16 ENCOUNTER — Inpatient Hospital Stay: Payer: BC Managed Care – PPO | Attending: Oncology | Admitting: Oncology

## 2020-02-16 ENCOUNTER — Other Ambulatory Visit: Payer: Self-pay

## 2020-02-16 ENCOUNTER — Encounter: Payer: Self-pay | Admitting: Oncology

## 2020-02-16 VITALS — HR 82 | Temp 97.8°F | Resp 20 | Wt 141.3 lb

## 2020-02-16 DIAGNOSIS — Z87891 Personal history of nicotine dependence: Secondary | ICD-10-CM | POA: Diagnosis not present

## 2020-02-16 DIAGNOSIS — Z85828 Personal history of other malignant neoplasm of skin: Secondary | ICD-10-CM | POA: Insufficient documentation

## 2020-02-16 DIAGNOSIS — D72819 Decreased white blood cell count, unspecified: Secondary | ICD-10-CM

## 2020-02-16 LAB — CBC WITH DIFFERENTIAL/PLATELET
Abs Immature Granulocytes: 0.01 10*3/uL (ref 0.00–0.07)
Basophils Absolute: 0 10*3/uL (ref 0.0–0.1)
Basophils Relative: 1 %
Eosinophils Absolute: 0.1 10*3/uL (ref 0.0–0.5)
Eosinophils Relative: 1 %
HCT: 40.1 % (ref 36.0–46.0)
Hemoglobin: 14.1 g/dL (ref 12.0–15.0)
Immature Granulocytes: 0 %
Lymphocytes Relative: 35 %
Lymphs Abs: 1.6 10*3/uL (ref 0.7–4.0)
MCH: 31.6 pg (ref 26.0–34.0)
MCHC: 35.2 g/dL (ref 30.0–36.0)
MCV: 89.9 fL (ref 80.0–100.0)
Monocytes Absolute: 0.4 10*3/uL (ref 0.1–1.0)
Monocytes Relative: 8 %
Neutro Abs: 2.4 10*3/uL (ref 1.7–7.7)
Neutrophils Relative %: 55 %
Platelets: 212 10*3/uL (ref 150–400)
RBC: 4.46 MIL/uL (ref 3.87–5.11)
RDW: 12 % (ref 11.5–15.5)
WBC: 4.5 10*3/uL (ref 4.0–10.5)
nRBC: 0 % (ref 0.0–0.2)

## 2020-02-18 NOTE — Progress Notes (Signed)
[facility]  Telephone:(336) [phone] Fax:(336) [phone]  ID: Alicia Valencia OB: [birth year]  MR#: [id number]  DHR#:[id number]  Patient Care Team: Marval Regal, NP as PCP - General (Nurse Practitioner)  CHIEF COMPLAINT: Leukopenia, unspecified.  INTERVAL HISTORY: Patient is a 64 year old female who was noted to have a persistently elevated white blood cell count on routine blood works.  She reports her white blood cell count has been decrease for decades.  She currently feels well and is asymptomatic.  She denied any repeated infections.  She has no fevers.  She has no neurologic complaints.  She has a good appetite and denies weight loss.  She has no chest pain, cough, shortness of breath, or hemoptysis. She has no nausea, vomiting, constipation, or diarrhea. She has no urinary complains.  Patient feels at her baseline and offers no specific complaint today.  REVIEW OF SYSTEMS:   Review of Systems  Constitutional: Negative.  Negative for fever, malaise/fatigue and weight loss.  Respiratory: Negative.  Negative for cough, hemoptysis and shortness of breath.   Cardiovascular: Negative.  Negative for chest pain and leg swelling.  Gastrointestinal: Negative.  Negative for abdominal pain.  Genitourinary: Negative.  Negative for dysuria.  Musculoskeletal: Negative.  Negative for back pain.  Skin: Negative.  Negative for rash.  Neurological: Negative.  Negative for dizziness, focal weakness, weakness and headaches.  Psychiatric/Behavioral: Negative.  The patient is not nervous/anxious.     As per HPI. Otherwise, a complete review of systems is negative.  PAST MEDICAL HISTORY: Past Medical History:  Diagnosis Date   Basal cell carcinoma    Chicken pox    History of fainting spells of unknown cause     PAST SURGICAL HISTORY: Past Surgical History:  Procedure Laterality Date   BREAST BIOPSY Left    negative core   CESAREAN SECTION     THYROIDECTOMY       FAMILY HISTORY: Family History  Problem Relation Age of Onset   Heart disease Mother    Hypertension Mother    Diabetes Mother    Diabetes Father    Hearing loss Father    Hypertension Sister    Diabetes Maternal Aunt    Diabetes Maternal Uncle    Diabetes Maternal Grandmother    Diabetes Maternal Grandfather    Breast cancer Neg Hx     ADVANCED DIRECTIVES (Y/N):  N  HEALTH MAINTENANCE: Social History   Tobacco Use   Smoking status: Former Smoker   Smokeless tobacco: Never Used  Scientific laboratory technician Use: Never used  Substance Use Topics   Alcohol use: Yes   Drug use: No     Colonoscopy:  PAP:  Bone density:  Lipid panel:  No Known Allergies  Current Outpatient Medications  Medication Sig Dispense Refill   alendronate (FOSAMAX) 70 MG tablet Take 1 tablet (70 mg total) by mouth every 7 (seven) days for 90 doses. Take with a full glass of water on an empty stomach. 12 tablet 6   azelastine (ASTELIN) 0.1 % nasal spray Place 1 spray into both nostrils 2 (two) times daily. Use in each nostril as directed 9 mL 0   calcium citrate-vitamin D (CITRACAL+D) 315-200 MG-UNIT tablet Take by mouth.     ferrous gluconate (FERGON) 324 MG tablet Take 1 tablet (324 mg total) by mouth daily with breakfast. 90 tablet 3   Multiple Vitamins-Minerals (PRESERVISION AREDS 2 PO) Take 2 tablets by mouth daily.     Omega-3 Fatty Acids (  FISH OIL PO) Take 1,500 mg by mouth daily.     No current facility-administered medications for this visit.    OBJECTIVE: Vitals:   02/16/20 1125  Pulse: 82  Resp: 20  Temp: 97.8 F (36.6 C)  SpO2: 99%     Body mass index is 24.25 kg/m.    ECOG FS:0 - Asymptomatic  General: Well-developed, well-nourished, no acute distress. Eyes: Pink conjunctiva, anicteric sclera. HEENT: Normocephalic, moist mucous membranes. Lungs: No audible wheezing or coughing. Heart: Regular rate and rhythm. Abdomen: Soft, nontender, no obvious  distention. Musculoskeletal: No edema, cyanosis, or clubbing. Neuro: Alert, answering all questions appropriately. Cranial nerves grossly intact. Skin: No rashes or petechiae noted. Psych: Normal affect. Lymphatics: No cervical, calvicular, axillary or inguinal LAD.   LAB RESULTS:  Lab Results  Component Value Date   NA 139 01/22/2020   K 4.0 01/22/2020   CL 104 01/22/2020   CO2 27 01/22/2020   GLUCOSE 93 01/22/2020   BUN 13 01/22/2020   CREATININE 0.71 01/22/2020   CALCIUM 9.3 01/22/2020   PROT 6.6 01/22/2020   ALBUMIN 4.3 01/22/2020   AST 23 01/22/2020   ALT 25 01/22/2020   ALKPHOS 70 01/22/2020   BILITOT 0.4 01/22/2020    Lab Results  Component Value Date   WBC 4.5 02/16/2020   NEUTROABS 2.4 02/16/2020   HGB 14.1 02/16/2020   HCT 40.1 02/16/2020   MCV 89.9 02/16/2020   PLT 212 02/16/2020     STUDIES: No results found.  ASSESSMENT: Leukopenia, unspecified.  PLAN:    1. Leukopenia, unspecified:  Resolved. Patient's white blood cell count is now within normal limits. She reports her white blood cell count has been decreased for several decades.  Have ordered peripheral blood flow cytometry, antineutrophil antibodies, and Int[employer] Myeloid panel for completeness and these are pending at the time of dictation.  No intervention is needed at this time. She does not require a bone marrow biopsy. Patient will have a video visit in 3 weeks to discuss her results.   I spent a total of 45 minutes reviewing chart data, face-to-face evaluation with the patient, counseling and coordination of care as detailed above.   Patient expressed understanding and was in agreement with this plan. She also understands that She can call clinic at any time with any questions, concerns, or complaints.    Lloyd Huger, MD   02/18/2020 8:48 AM

## 2020-02-19 LAB — COMP PANEL: LEUKEMIA/LYMPHOMA

## 2020-02-27 ENCOUNTER — Encounter: Payer: Self-pay | Admitting: Nurse Practitioner

## 2020-02-27 LAB — INTELLIGEN MYELOID

## 2020-03-06 ENCOUNTER — Ambulatory Visit (INDEPENDENT_AMBULATORY_CARE_PROVIDER_SITE_OTHER): Payer: BC Managed Care – PPO

## 2020-03-06 ENCOUNTER — Ambulatory Visit: Payer: BC Managed Care – PPO

## 2020-03-06 ENCOUNTER — Other Ambulatory Visit: Payer: Self-pay

## 2020-03-06 DIAGNOSIS — J301 Allergic rhinitis due to pollen: Secondary | ICD-10-CM | POA: Diagnosis not present

## 2020-03-06 DIAGNOSIS — H6123 Impacted cerumen, bilateral: Secondary | ICD-10-CM | POA: Diagnosis not present

## 2020-03-06 DIAGNOSIS — Z23 Encounter for immunization: Secondary | ICD-10-CM | POA: Diagnosis not present

## 2020-03-06 DIAGNOSIS — K219 Gastro-esophageal reflux disease without esophagitis: Secondary | ICD-10-CM | POA: Diagnosis not present

## 2020-03-06 DIAGNOSIS — R490 Dysphonia: Secondary | ICD-10-CM | POA: Diagnosis not present

## 2020-03-06 NOTE — Progress Notes (Signed)
Patient presented for Shingles vaccine to left deltoid, patient voiced no concerns nor showed any signs of distress during injection.

## 2020-03-07 LAB — NEUTROPHIL AB TEST LEVEL 1: NEUTROPHIL SCR/PANEL INTERP.: NEGATIVE

## 2020-03-08 ENCOUNTER — Inpatient Hospital Stay: Payer: BC Managed Care – PPO | Admitting: Oncology

## 2020-03-11 DIAGNOSIS — D225 Melanocytic nevi of trunk: Secondary | ICD-10-CM | POA: Diagnosis not present

## 2020-03-11 DIAGNOSIS — Z85828 Personal history of other malignant neoplasm of skin: Secondary | ICD-10-CM | POA: Diagnosis not present

## 2020-03-11 DIAGNOSIS — L570 Actinic keratosis: Secondary | ICD-10-CM | POA: Diagnosis not present

## 2020-03-11 DIAGNOSIS — X32XXXA Exposure to sunlight, initial encounter: Secondary | ICD-10-CM | POA: Diagnosis not present

## 2020-03-11 DIAGNOSIS — D2262 Melanocytic nevi of left upper limb, including shoulder: Secondary | ICD-10-CM | POA: Diagnosis not present

## 2020-03-11 DIAGNOSIS — C4441 Basal cell carcinoma of skin of scalp and neck: Secondary | ICD-10-CM | POA: Diagnosis not present

## 2020-03-11 DIAGNOSIS — D2261 Melanocytic nevi of right upper limb, including shoulder: Secondary | ICD-10-CM | POA: Diagnosis not present

## 2020-03-11 DIAGNOSIS — D485 Neoplasm of uncertain behavior of skin: Secondary | ICD-10-CM | POA: Diagnosis not present

## 2020-03-11 DIAGNOSIS — C44519 Basal cell carcinoma of skin of other part of trunk: Secondary | ICD-10-CM | POA: Diagnosis not present

## 2020-03-15 ENCOUNTER — Other Ambulatory Visit: Payer: Self-pay

## 2020-03-15 ENCOUNTER — Ambulatory Visit
Admission: RE | Admit: 2020-03-15 | Discharge: 2020-03-15 | Disposition: A | Discharge status: Home / Self Care | Payer: BC Managed Care – PPO | Source: Ambulatory Visit | Attending: Nurse Practitioner | Admitting: Nurse Practitioner

## 2020-03-15 DIAGNOSIS — N6325 Unspecified lump in the left breast, overlapping quadrants: Secondary | ICD-10-CM

## 2020-03-15 DIAGNOSIS — R922 Inconclusive mammogram: Secondary | ICD-10-CM | POA: Diagnosis not present

## 2020-03-15 DIAGNOSIS — N6489 Other specified disorders of breast: Secondary | ICD-10-CM | POA: Diagnosis not present

## 2020-04-08 DIAGNOSIS — C4441 Basal cell carcinoma of skin of scalp and neck: Secondary | ICD-10-CM | POA: Diagnosis not present

## 2020-04-18 DIAGNOSIS — L570 Actinic keratosis: Secondary | ICD-10-CM | POA: Diagnosis not present

## 2020-04-18 DIAGNOSIS — C44519 Basal cell carcinoma of skin of other part of trunk: Secondary | ICD-10-CM | POA: Diagnosis not present

## 2020-05-20 DIAGNOSIS — J301 Allergic rhinitis due to pollen: Secondary | ICD-10-CM | POA: Diagnosis not present

## 2020-05-20 DIAGNOSIS — K219 Gastro-esophageal reflux disease without esophagitis: Secondary | ICD-10-CM | POA: Diagnosis not present

## 2020-05-20 DIAGNOSIS — H6120 Impacted cerumen, unspecified ear: Secondary | ICD-10-CM | POA: Diagnosis not present

## 2020-05-20 DIAGNOSIS — R490 Dysphonia: Secondary | ICD-10-CM | POA: Diagnosis not present

## 2020-06-13 ENCOUNTER — Other Ambulatory Visit: Payer: Self-pay

## 2020-06-13 ENCOUNTER — Encounter: Payer: Self-pay | Admitting: Gastroenterology

## 2020-06-13 ENCOUNTER — Ambulatory Visit (INDEPENDENT_AMBULATORY_CARE_PROVIDER_SITE_OTHER): Payer: BC Managed Care – PPO | Admitting: Gastroenterology

## 2020-06-13 VITALS — BP 168/87 | HR 64 | Ht 64.0 in | Wt 141.8 lb

## 2020-06-13 DIAGNOSIS — R6889 Other general symptoms and signs: Secondary | ICD-10-CM | POA: Diagnosis not present

## 2020-06-13 DIAGNOSIS — R0989 Other specified symptoms and signs involving the circulatory and respiratory systems: Secondary | ICD-10-CM

## 2020-06-13 DIAGNOSIS — H179 Unspecified corneal scar and opacity: Secondary | ICD-10-CM | POA: Diagnosis not present

## 2020-06-13 DIAGNOSIS — K219 Gastro-esophageal reflux disease without esophagitis: Secondary | ICD-10-CM

## 2020-06-13 MED ORDER — PANTOPRAZOLE SODIUM 40 MG PO TBEC: 40.0000 mg | DELAYED_RELEASE_TABLET | Freq: Every day | ORAL | 6 refills | Status: DC

## 2020-06-13 NOTE — Progress Notes (Signed)
Gastroenterology Consultation  Referring Provider:     Marval Regal, NP Primary Care Physician:  Marval Regal, NP Primary Gastroenterologist:  Dr. Allen Norris     Reason for Consultation:     GERD        HPI:   Alicia Valencia is a 65 y.o. y/o female referred for consultation & management of GERD by Dr. Jerelene Redden, Janalyn Harder, NP.  This patient was referred to me from ENT after being seen therefore postnasal drip and throat clearing for over a year.The patient has a history of anemia but her most recent lab work showed her ferritin and iron studies to be normal. The patient reports that when she saw her ENT and he looked at her throat there was a concern of whether she was having postnasal drip or reflux symptoms.  She was asked to see me.  She denies any overt heartburn or dysphagia.  She also denies any unexplained weight loss fevers chills nausea or vomiting.  She states that she had a colonoscopy in [place] few years ago and is not due for colonoscopy for 10 years after the last one.  She is not having any other GI problems at the present time.  There is no report of any burning sensation in her throat and she denies any feeling of any food regurgitating.  The patient has been taking Pepcid and does not feel any relief.  She also states that when she chews gum that will relieve her symptoms of throat clearing but she states that she cannot chew gum all day long.  Past Medical History:  Diagnosis Date  . Basal cell carcinoma   . Chicken pox   . History of fainting spells of unknown cause     Past Surgical History:  Procedure Laterality Date  . BREAST BIOPSY Left    negative core  . CESAREAN SECTION    . THYROIDECTOMY      Prior to Admission medications   Medication Sig Start Date End Date Taking? Authorizing Provider  alendronate (FOSAMAX) 70 MG tablet Take 1 tablet (70 mg total) by mouth every 7 (seven) days for 90 doses. Take with a full glass of water on an empty stomach. 02/12/20  10/28/21  Marval Regal, NP  azelastine (ASTELIN) 0.1 % nasal spray Place 1 spray into both nostrils 2 (two) times daily. Use in each nostril as directed 02/12/20 03/13/20  Marval Regal, NP  calcium citrate-vitamin D (CITRACAL+D) 315-200 MG-UNIT tablet Take by mouth.    [provider]  ferrous gluconate (FERGON) 324 MG tablet Take 1 tablet (324 mg total) by mouth daily with breakfast. 05/18/19   Lucille Passy, MD  Multiple Vitamins-Minerals (PRESERVISION AREDS 2 PO) Take 2 tablets by mouth daily.    [provider]  Omega-3 Fatty Acids (FISH OIL PO) Take 1,500 mg by mouth daily.    [provider]    Family History  Problem Relation Age of Onset  . Heart disease Mother   . Hypertension Mother   . Diabetes Mother   . Diabetes Father   . Hearing loss Father   . Hypertension Sister   . Diabetes Maternal Aunt   . Diabetes Maternal Uncle   . Diabetes Maternal Grandmother   . Diabetes Maternal Grandfather   . Breast cancer Neg Hx      Social History   Tobacco Use  . Smoking status: Former Research scientist (life sciences)  . Smokeless tobacco: Never Used  Vaping Use  .  Vaping Use: Never used  Substance Use Topics  . Alcohol use: Yes  . Drug use: No    Allergies as of 06/13/2020  . (No Known Allergies)    Review of Systems:    All systems reviewed and negative except where noted in HPI.   Physical Exam:  There were no vitals taken for this visit. No LMP recorded. Patient is postmenopausal. General:   Alert,  Well-developed, well-nourished, pleasant and cooperative in NAD Head:  Normocephalic and atraumatic. Eyes:  Sclera clear, no icterus.   Conjunctiva pink. Ears:  Normal auditory acuity. Neck:  Supple; no masses or thyromegaly. Lungs:  Respirations even and unlabored.  Clear throughout to auscultation.   No wheezes, crackles, or rhonchi. No acute distress. Heart:  Regular rate and rhythm; no murmurs, clicks, rubs, or gallops. Abdomen:  Normal bowel sounds.  No  bruits.  Soft, non-tender and non-distended without masses, hepatosplenomegaly or hernias noted.  No guarding or rebound tenderness.  Negative Carnett sign.   Rectal:  Deferred.  Pulses:  Normal pulses noted. Extremities:  No clubbing or edema.  No cyanosis. Neurologic:  Alert and oriented x3;  grossly normal neurologically. Skin:  Intact without significant lesions or rashes.  No jaundice. Lymph Nodes:  No significant cervical adenopathy. Psych:  Alert and cooperative. Normal mood and affect.  Imaging Studies: No results found.  Assessment and Plan:   Alicia Valencia is a 65 y.o. y/o female who comes in today with a possible diagnosis of GERD.  The patients symptoms are throat clearing and she was sent to me for possible reflux as the cause of this.  The patient has been taking a H2 blocker without any relief.  The patient will be switched to Protonix as a treatment and a test to see if her symptoms go away.  The patient has been told that if her symptoms do not resolve with a PPI then it is unlikely reflux that is causing her throat clearing.  That if her symptoms do not resolve then she may need an upper endoscopy to look for any signs of damage or inflammation in the esophagus from possible silent reflux.  The patient has also been the patient has been explained the plan and agrees with it.    Lucilla Lame, MD. Marval Regal    Note: This dictation was prepared with Dragon dictation along with smaller phrase technology. Any transcriptional errors that result from this process are unintentional.

## 2020-07-16 ENCOUNTER — Other Ambulatory Visit: Payer: Self-pay

## 2020-07-16 MED ORDER — PANTOPRAZOLE SODIUM 40 MG PO TBEC: 40.0000 mg | DELAYED_RELEASE_TABLET | Freq: Every day | ORAL | 3 refills | Status: AC

## 2020-07-24 DIAGNOSIS — Z85828 Personal history of other malignant neoplasm of skin: Secondary | ICD-10-CM | POA: Diagnosis not present

## 2020-07-24 DIAGNOSIS — X32XXXA Exposure to sunlight, initial encounter: Secondary | ICD-10-CM | POA: Diagnosis not present

## 2020-07-24 DIAGNOSIS — L570 Actinic keratosis: Secondary | ICD-10-CM | POA: Diagnosis not present

## 2020-07-24 DIAGNOSIS — D2272 Melanocytic nevi of left lower limb, including hip: Secondary | ICD-10-CM | POA: Diagnosis not present

## 2020-07-24 DIAGNOSIS — D2262 Melanocytic nevi of left upper limb, including shoulder: Secondary | ICD-10-CM | POA: Diagnosis not present

## 2020-07-24 DIAGNOSIS — D2261 Melanocytic nevi of right upper limb, including shoulder: Secondary | ICD-10-CM | POA: Diagnosis not present

## 2020-07-26 ENCOUNTER — Telehealth: Payer: Self-pay

## 2020-07-26 DIAGNOSIS — Z23 Encounter for immunization: Secondary | ICD-10-CM

## 2020-07-26 NOTE — Telephone Encounter (Signed)
Pt wants her second shingle shot. Pt saw Dawson Bills, NP as her PCP. She got her first shot in December. Is she able to get her second one here even though PCP is not longer at this practice? Please advise

## 2020-07-26 NOTE — Telephone Encounter (Signed)
Patient has felt a little relief from the [facility] but still not feeling her best. Wants to know if she should come back in the office? Or try a new prescription?

## 2020-07-29 NOTE — Telephone Encounter (Signed)
Dr. Derrel Nip has agreed to sign-off shingrix. But we will need to go the regular route for scheduling TOC and then the PE.

## 2020-07-30 ENCOUNTER — Other Ambulatory Visit: Payer: Self-pay

## 2020-07-30 DIAGNOSIS — K219 Gastro-esophageal reflux disease without esophagitis: Secondary | ICD-10-CM

## 2020-07-30 DIAGNOSIS — R6889 Other general symptoms and signs: Secondary | ICD-10-CM

## 2020-07-30 DIAGNOSIS — R0989 Other specified symptoms and signs involving the circulatory and respiratory systems: Secondary | ICD-10-CM

## 2020-07-30 NOTE — Telephone Encounter (Addendum)
Scheduled patient shingrix injection but unable to schedule TOC , due to patient wants to see Dr. Nicki Reaper whom is not currently seeing new patients. Dr. Derrel Nip agreed to sign-off on second shingles injection.

## 2020-07-30 NOTE — Telephone Encounter (Signed)
Returned pt's call regarding her symptoms. Advised pt per Dr. Dorothey Baseman office note, if no improvement in her symptoms then he recommends an EGD. Pt scheduled at [facility] on 08/08/20. Instructions have been sent through mychart.

## 2020-08-01 ENCOUNTER — Encounter: Payer: Self-pay | Admitting: Gastroenterology

## 2020-08-06 ENCOUNTER — Other Ambulatory Visit: Payer: Self-pay

## 2020-08-06 ENCOUNTER — Ambulatory Visit (INDEPENDENT_AMBULATORY_CARE_PROVIDER_SITE_OTHER): Payer: BC Managed Care – PPO

## 2020-08-06 DIAGNOSIS — Z23 Encounter for immunization: Secondary | ICD-10-CM | POA: Diagnosis not present

## 2020-08-06 NOTE — Progress Notes (Signed)
Patient presented for shingrix injection to left deltoid, patient voiced no concerns nor showed any signs of distress during injection. 

## 2020-08-07 NOTE — Discharge Instructions (Signed)

## 2020-08-08 ENCOUNTER — Ambulatory Visit
Admission: RE | Admit: 2020-08-08 | Discharge: 2020-08-08 | Disposition: A | Discharge status: Home / Self Care | Payer: BC Managed Care – PPO | Source: Ambulatory Visit | Attending: Gastroenterology | Admitting: Gastroenterology

## 2020-08-08 ENCOUNTER — Ambulatory Visit: Payer: BC Managed Care – PPO | Admitting: Anesthesiology

## 2020-08-08 ENCOUNTER — Encounter
Admission: RE | Disposition: A | Discharge status: Home / Self Care | Payer: Self-pay | Source: Ambulatory Visit | Attending: Gastroenterology

## 2020-08-08 ENCOUNTER — Encounter: Payer: Self-pay | Admitting: Gastroenterology

## 2020-08-08 ENCOUNTER — Other Ambulatory Visit: Payer: Self-pay

## 2020-08-08 DIAGNOSIS — R0989 Other specified symptoms and signs involving the circulatory and respiratory systems: Secondary | ICD-10-CM

## 2020-08-08 DIAGNOSIS — K219 Gastro-esophageal reflux disease without esophagitis: Secondary | ICD-10-CM | POA: Insufficient documentation

## 2020-08-08 DIAGNOSIS — Z87891 Personal history of nicotine dependence: Secondary | ICD-10-CM | POA: Diagnosis not present

## 2020-08-08 DIAGNOSIS — Z7983 Long term (current) use of bisphosphonates: Secondary | ICD-10-CM | POA: Diagnosis not present

## 2020-08-08 DIAGNOSIS — R6889 Other general symptoms and signs: Secondary | ICD-10-CM

## 2020-08-08 HISTORY — PX: ESOPHAGOGASTRODUODENOSCOPY (EGD) WITH PROPOFOL: SHX5813

## 2020-08-08 HISTORY — DX: Other specified postprocedural states: Z98.890

## 2020-08-08 HISTORY — DX: Nausea with vomiting, unspecified: R11.2

## 2020-08-08 HISTORY — DX: Unspecified osteoarthritis, unspecified site: M19.90

## 2020-08-08 HISTORY — DX: Personal history of diseases of the blood and blood-forming organs and certain disorders involving the immune mechanism: Z86.2

## 2020-08-08 SURGERY — ESOPHAGOGASTRODUODENOSCOPY (EGD) WITH PROPOFOL
Anesthesia: General | Site: Throat

## 2020-08-08 MED ORDER — GLYCOPYRROLATE 0.2 MG/ML IJ SOLN
INTRAMUSCULAR | Status: DC | PRN
  Administered 2020-08-08: .1 mg via INTRAVENOUS

## 2020-08-08 MED ORDER — SODIUM CHLORIDE 0.9 % IV SOLN: INTRAVENOUS | Status: DC

## 2020-08-08 MED ORDER — LIDOCAINE HCL (CARDIAC) PF 100 MG/5ML IV SOSY
PREFILLED_SYRINGE | INTRAVENOUS | Status: DC | PRN
  Administered 2020-08-08: 50 mg via INTRAVENOUS

## 2020-08-08 MED ORDER — LACTATED RINGERS IV SOLN: INTRAVENOUS | Status: DC

## 2020-08-08 MED ORDER — PROPOFOL 10 MG/ML IV BOLUS
INTRAVENOUS | Status: DC | PRN
  Administered 2020-08-08: 70 mg via INTRAVENOUS
  Administered 2020-08-08: 30 mg via INTRAVENOUS
  Administered 2020-08-08: 20 mg via INTRAVENOUS

## 2020-08-08 SURGICAL SUPPLY — 7 items
BLOCK BITE 60FR ADLT L/F GRN (MISCELLANEOUS) ×2 IMPLANT
GOWN CVR UNV OPN BCK APRN NK (MISCELLANEOUS) ×2 IMPLANT
GOWN ISOL THUMB LOOP REG UNIV (MISCELLANEOUS) ×4
KIT PRC NS LF DISP ENDO (KITS) ×1 IMPLANT
KIT PROCEDURE OLYMPUS (KITS) ×2
MANIFOLD NEPTUNE II (INSTRUMENTS) ×2 IMPLANT
WATER STERILE IRR 250ML POUR (IV SOLUTION) ×2 IMPLANT

## 2020-08-08 NOTE — H&P (Signed)
Lucilla Lame, MD [facility] [address], [place], [place] [address] Phone:[phone] Fax : [phone]  Primary Care Physician:  Marval Regal, NP Primary Gastroenterologist:  Dr. Allen Norris  Pre-Procedure History & Physical: HPI:  Alicia Valencia is a 65 y.o. female is here for an endoscopy.   Past Medical History:  Diagnosis Date  . Arthritis   . Basal cell carcinoma   . Chicken pox   . History of anemia   . History of fainting spells of unknown cause   . PONV (postoperative nausea and vomiting)     Past Surgical History:  Procedure Laterality Date  . BREAST BIOPSY Left    negative core  . CESAREAN SECTION    . COLONOSCOPY  04/2012   normal - repeat 10 years - done in [place], [place]  . THYROIDECTOMY      Prior to Admission medications   Medication Sig Start Date End Date Taking? Authorizing Provider  alendronate (FOSAMAX) 70 MG tablet Take 1 tablet (70 mg total) by mouth every 7 (seven) days for 90 doses. Take with a full glass of water on an empty stomach. 02/12/20 10/28/21 Yes Marval Regal, NP  calcium citrate-vitamin D (CITRACAL+D) 315-200 MG-UNIT tablet Take by mouth.   Yes [provider]  Multiple Vitamins-Minerals (PRESERVISION AREDS 2 PO) Take 2 tablets by mouth daily.   Yes [provider]  Omega-3 Fatty Acids (FISH OIL PO) Take 1,500 mg by mouth daily.   Yes [provider]  pantoprazole (PROTONIX) 40 MG tablet Take 1 tablet (40 mg total) by mouth daily. 07/16/20  Yes Lucilla Lame, MD    Allergies as of 07/30/2020  . (No Known Allergies)    Family History  Problem Relation Age of Onset  . Heart disease Mother   . Hypertension Mother   . Diabetes Mother   . Diabetes Father   . Hearing loss Father   . Hypertension Sister   . Diabetes Maternal Aunt   . Diabetes Maternal Uncle   . Diabetes Maternal Grandmother   . Diabetes Maternal Grandfather   . Breast cancer Neg Hx     Social History   Socioeconomic History  .  Marital status: Married    Spouse name: Not on file  . Number of children: Not on file  . Years of education: Not on file  . Highest education level: Not on file  Occupational History  . Not on file  Tobacco Use  . Smoking status: Former Research scientist (life sciences)  . Smokeless tobacco: Never Used  Vaping Use  . Vaping Use: Never used  Substance and Sexual Activity  . Alcohol use: Yes  . Drug use: No  . Sexual activity: Yes  Other Topics Concern  . Not on file  Social History Narrative   Retired from Personal assistant. Married lives w husband- 2 grown kids out of state -[employer]. For fun-garden and walks.    Social Determinants of Health   Financial Resource Strain: Not on file  Food Insecurity: Not on file  Transportation Needs: Not on file  Physical Activity: Not on file  Stress: Not on file  Social Connections: Not on file  Intimate Partner Violence: Not on file    Review of Systems: See HPI, otherwise negative ROS  Physical Exam: BP 136/80   Pulse (!) 58   Temp 97.6 F (36.4 C) (Temporal)   Ht 5\' 4"  (1.626 m)   Wt 62.6 kg   SpO2 99%   BMI 23.69 kg/m  General:   Alert,  pleasant and cooperative in NAD Head:  Normocephalic and atraumatic. Neck:  Supple; no masses or thyromegaly. Lungs:  Clear throughout to auscultation.    Heart:  Regular rate and rhythm. Abdomen:  Soft, nontender and nondistended. Normal bowel sounds, without guarding, and without rebound.   Neurologic:  Alert and  oriented x4;  grossly normal neurologically.  Impression/Plan: Alicia Valencia is here for an endoscopy to be performed for GERD  Risks, benefits, limitations, and alternatives regarding  endoscopy have been reviewed with the patient.  Questions have been answered.  All parties agreeable.   Lucilla Lame, MD  08/08/2020, 10:33 AM

## 2020-08-08 NOTE — Anesthesia Procedure Notes (Signed)
Performed by: Gillie Crisci, CRNA Pre-anesthesia Checklist: Patient identified, Emergency Drugs available, Suction available, Timeout performed and Patient being monitored Patient Re-evaluated:Patient Re-evaluated prior to induction Oxygen Delivery Method: Nasal cannula Placement Confirmation: positive ETCO2       

## 2020-08-08 NOTE — Anesthesia Preprocedure Evaluation (Signed)
Anesthesia Evaluation  Patient identified by MRN, date of birth, ID band Patient awake    Reviewed: Allergy & Precautions, NPO status   History of Anesthesia Complications (+) PONV  Airway Mallampati: II  TM Distance: >3 FB     Dental   Pulmonary former smoker,    Pulmonary exam normal        Cardiovascular negative cardio ROS   Rhythm:Regular Rate:Normal     Neuro/Psych    GI/Hepatic   Endo/Other    Renal/GU      Musculoskeletal  (+) Arthritis ,   Abdominal   Peds  Hematology  (+) anemia ,   Anesthesia Other Findings   Reproductive/Obstetrics                             Anesthesia Physical Anesthesia Plan  ASA: II  Anesthesia Plan: General   Post-op Pain Management:    Induction: Intravenous  PONV Risk Score and Plan: Propofol infusion, TIVA and Treatment may vary due to age or medical condition  Airway Management Planned: Natural Airway and Nasal Cannula  Additional Equipment:   Intra-op Plan:   Post-operative Plan:   Informed Consent: I have reviewed the patients History and Physical, chart, labs and discussed the procedure including the risks, benefits and alternatives for the proposed anesthesia with the patient or authorized representative who has indicated his/her understanding and acceptance.       Plan Discussed with: CRNA  Anesthesia Plan Comments:         Anesthesia Quick Evaluation

## 2020-08-08 NOTE — Transfer of Care (Signed)
Immediate Anesthesia Transfer of Care Note  Patient: Alicia Valencia  Procedure(s) Performed: ESOPHAGOGASTRODUODENOSCOPY (EGD) WITH PROPOFOL (N/A Throat)  Patient Location: PACU  Anesthesia Type: General  Level of Consciousness: awake, alert  and patient cooperative  Airway and Oxygen Therapy: Patient Spontanous Breathing and Patient connected to supplemental oxygen  Post-op Assessment: Post-op Vital signs reviewed, Patient's Cardiovascular Status Stable, Respiratory Function Stable, Patent Airway and No signs of Nausea or vomiting  Post-op Vital Signs: Reviewed and stable  Complications: No complications documented.

## 2020-08-08 NOTE — Op Note (Signed)
[facility] Gastroenterology Patient Name: Alicia Valencia Procedure Date: 08/08/2020 11:00 AM MRN: [id number] Account #: [id number] Date of Birth: 05/01/1955 Admit Type: Outpatient Age: 65 Room: [facility] OR ROOM 01 Gender: Female Note Status: Finalized Procedure:             Upper GI endoscopy Indications:           Suspected gastro-esophageal reflux disease Providers:             Lucilla Lame MD, MD Referring MD:          Janalyn Harder. Jerelene Redden, NP (Referring MD) Medicines:             Propofol per Anesthesia Complications:         No immediate complications. Procedure:             Pre-Anesthesia Assessment:                        - Prior to the procedure, a History and Physical was                         performed, and patient medications and allergies were                         reviewed. The patient's tolerance of previous                         anesthesia was also reviewed. The risks and benefits                         of the procedure and the sedation options and risks                         were discussed with the patient. All questions were                         answered, and informed consent was obtained. Prior                         Anticoagulants: The patient has taken no previous                         anticoagulant or antiplatelet agents. ASA Grade                         Assessment: II - A patient with mild systemic disease.                         After reviewing the risks and benefits, the patient                         was deemed in satisfactory condition to undergo the                         procedure.                        After obtaining informed consent, the endoscope was  passed under direct vision. Throughout the procedure,                         the patient's blood pressure, pulse, and oxygen                         saturations were monitored continuously. The was                         introduced through the mouth,  and advanced to the                         second part of duodenum. The upper GI endoscopy was                         accomplished without difficulty. The patient tolerated                         the procedure well. Findings:      The examined esophagus was normal.      The stomach was normal.      The examined duodenum was normal. Impression:            - Normal esophagus.                        - Normal stomach.                        - Normal examined duodenum.                        - No specimens collected. Recommendation:        - Discharge patient to home.                        - Resume previous diet.                        - Continue present medications.                        - Perform ambulatory pH monitoring. Procedure Code(s):     --- Professional ---                        [phone], Esophagogastroduodenoscopy, flexible,                         transoral; diagnostic, including collection of                         specimen(s) by brushing or washing, when performed                         (separate procedure) Diagnosis Code(s):     --- Professional ---                        K21.9, Gastro-esophageal reflux disease without                         esophagitis CPT copyright 2019 American Medical Association.  All rights reserved. The codes documented in this report are preliminary and upon coder review may  be revised to meet current compliance requirements. Lucilla Lame MD, MD 08/08/2020 11:14:54 AM This report has been signed electronically. Number of Addenda: 0 Note Initiated On: 08/08/2020 11:00 AM Total Procedure Duration: 0 hours 1 minute 51 seconds  Estimated Blood Loss:  Estimated blood loss: none.      [facility]

## 2020-08-08 NOTE — Anesthesia Postprocedure Evaluation (Signed)
Anesthesia Post Note  Patient: Alicia Valencia  Procedure(s) Performed: ESOPHAGOGASTRODUODENOSCOPY (EGD) WITH PROPOFOL (N/A Throat)     Patient location during evaluation: PACU Anesthesia Type: General Level of consciousness: awake Pain management: pain level controlled Vital Signs Assessment: post-procedure vital signs reviewed and stable Respiratory status: respiratory function stable Cardiovascular status: stable Postop Assessment: no signs of nausea or vomiting Anesthetic complications: no   No complications documented.  Veda Canning

## 2020-08-09 ENCOUNTER — Other Ambulatory Visit: Payer: Self-pay

## 2020-08-09 ENCOUNTER — Encounter: Payer: Self-pay | Admitting: Gastroenterology

## 2020-08-09 DIAGNOSIS — K219 Gastro-esophageal reflux disease without esophagitis: Secondary | ICD-10-CM

## 2020-08-15 ENCOUNTER — Telehealth: Payer: Self-pay | Admitting: Gastroenterology

## 2020-08-15 NOTE — Telephone Encounter (Signed)
Good afternoon, Dr. Silverio Decamp,  We received a referral from [facility] (Dr. Lucilla Lame)  for this patient to be seen for GI treatment.  The patient requested for you to be her GI provider.   Referral states patient has been diagnosed with gastroesophageal reflux disease, unspecified whether esophagitis present esophageal manometry.    It is noted patient had a Upper Endo 08/08/20.  Records are in epic.  Can you please review and advise on scheduling?  Thank you.

## 2020-08-23 NOTE — Telephone Encounter (Signed)
This was referral for pH study not for transfer of care.  Please schedule direct esophageal manometry and 24 ph impedance study off PPI at [facility]. She will follow up with Dr Allen Norris.

## 2020-08-26 ENCOUNTER — Other Ambulatory Visit: Payer: Self-pay

## 2020-08-26 DIAGNOSIS — K219 Gastro-esophageal reflux disease without esophagitis: Secondary | ICD-10-CM

## 2020-08-26 NOTE — Telephone Encounter (Signed)
Referral for esophageal manometry and 24hr Ph impedance received. 09/11/20 at 11:15 am scheduled. Patient contacted and instructed for procedure.

## 2020-09-11 ENCOUNTER — Encounter (HOSPITAL_COMMUNITY): Payer: Self-pay | Admitting: Gastroenterology

## 2020-09-11 ENCOUNTER — Ambulatory Visit (HOSPITAL_COMMUNITY)
Admission: RE | Admit: 2020-09-11 | Discharge: 2020-09-11 | Disposition: A | Discharge status: Home / Self Care | Payer: BC Managed Care – PPO | Attending: Gastroenterology | Admitting: Gastroenterology

## 2020-09-11 ENCOUNTER — Encounter (HOSPITAL_COMMUNITY)
Admission: RE | Disposition: A | Discharge status: Home / Self Care | Payer: Self-pay | Source: Home / Self Care | Attending: Gastroenterology

## 2020-09-11 DIAGNOSIS — R1310 Dysphagia, unspecified: Secondary | ICD-10-CM

## 2020-09-11 DIAGNOSIS — R0989 Other specified symptoms and signs involving the circulatory and respiratory systems: Secondary | ICD-10-CM

## 2020-09-11 DIAGNOSIS — R198 Other specified symptoms and signs involving the digestive system and abdomen: Secondary | ICD-10-CM

## 2020-09-11 DIAGNOSIS — K219 Gastro-esophageal reflux disease without esophagitis: Secondary | ICD-10-CM

## 2020-09-11 HISTORY — PX: ESOPHAGEAL MANOMETRY: SHX5429

## 2020-09-11 HISTORY — PX: 24 HOUR PH STUDY: SHX5419

## 2020-09-11 SURGERY — MANOMETRY, ESOPHAGUS
Anesthesia: Choice

## 2020-09-11 MED ORDER — LIDOCAINE VISCOUS HCL 2 % MT SOLN
OROMUCOSAL | Status: AC
  Filled 2020-09-11: qty 15

## 2020-09-11 SURGICAL SUPPLY — 2 items
FACESHIELD LNG OPTICON STERILE (SAFETY) IMPLANT
GLOVE BIO SURGEON STRL SZ8 (GLOVE) ×4 IMPLANT

## 2020-09-11 NOTE — Progress Notes (Signed)
Esophageal Manometry done per protocol. Patient tolerated well without distress or complication. Manometry probe removed per protocol. Ph impedance probe placed per protocol at 41 cm at left nare. Patient tolerated well. Education using teach back done regarding Ph impedance monitor and study. Patient voiced and demonstrated understanding . Patient will return tomorrow to have probe removed and monitor downloaded. Report to be sent to Dr. Harl Bowie.

## 2020-09-13 ENCOUNTER — Encounter (HOSPITAL_COMMUNITY): Payer: Self-pay | Admitting: Gastroenterology

## 2020-09-30 ENCOUNTER — Telehealth: Payer: Self-pay | Admitting: Gastroenterology

## 2020-09-30 NOTE — Telephone Encounter (Signed)
Inbound call from patient. States she had esophageal manometry on 09/11/20 and wanted her results. Best contact number [phone]

## 2020-10-07 DIAGNOSIS — R1310 Dysphagia, unspecified: Secondary | ICD-10-CM

## 2020-10-07 DIAGNOSIS — R198 Other specified symptoms and signs involving the digestive system and abdomen: Secondary | ICD-10-CM

## 2020-10-07 DIAGNOSIS — R0989 Other specified symptoms and signs involving the circulatory and respiratory systems: Secondary | ICD-10-CM

## 2020-10-07 NOTE — Telephone Encounter (Signed)
No significant abnormality. Please inform patient the results. Thanks

## 2020-10-08 NOTE — Telephone Encounter (Signed)
Esophageal manometry report faxed to Dr Lucilla Lame.

## 2020-10-10 ENCOUNTER — Telehealth: Payer: Self-pay

## 2020-10-10 NOTE — Telephone Encounter (Signed)
Pt notified of results through Mychart.  

## 2020-10-10 NOTE — Telephone Encounter (Signed)
-----   Message from Lucilla Lame, MD sent at 10/10/2020 12:23 PM EDT ----- Let the patient know that the esophageal manometry was completely normal without any motility issues.  The 24-hour pH probe did not show any pathological acid reflux while on the medication.  It did show some reflux events of gastric contents with very little acid due to her being on medication.  It was recommended that this could be corrected with antireflux surgery.  Interpreting these results show that it is unlikely the cause of her fullness feeling in her throat is the acid since her acid suppression on the medication is well controlled during the study.  If she would like to be considered for antireflux surgery then let us know.

## 2021-01-29 DIAGNOSIS — D2261 Melanocytic nevi of right upper limb, including shoulder: Secondary | ICD-10-CM | POA: Diagnosis not present

## 2021-01-29 DIAGNOSIS — Z85828 Personal history of other malignant neoplasm of skin: Secondary | ICD-10-CM | POA: Diagnosis not present

## 2021-01-29 DIAGNOSIS — D2262 Melanocytic nevi of left upper limb, including shoulder: Secondary | ICD-10-CM | POA: Diagnosis not present

## 2021-01-29 DIAGNOSIS — L820 Inflamed seborrheic keratosis: Secondary | ICD-10-CM | POA: Diagnosis not present

## 2021-01-29 DIAGNOSIS — D2271 Melanocytic nevi of right lower limb, including hip: Secondary | ICD-10-CM | POA: Diagnosis not present

## 2021-01-29 DIAGNOSIS — L570 Actinic keratosis: Secondary | ICD-10-CM | POA: Diagnosis not present

## 2021-01-29 DIAGNOSIS — L538 Other specified erythematous conditions: Secondary | ICD-10-CM | POA: Diagnosis not present

## 2021-02-03 DIAGNOSIS — E041 Nontoxic single thyroid nodule: Secondary | ICD-10-CM | POA: Diagnosis not present

## 2021-02-03 DIAGNOSIS — M810 Age-related osteoporosis without current pathological fracture: Secondary | ICD-10-CM | POA: Diagnosis not present

## 2021-02-06 ENCOUNTER — Other Ambulatory Visit: Payer: Self-pay | Admitting: Internal Medicine

## 2021-02-06 DIAGNOSIS — M810 Age-related osteoporosis without current pathological fracture: Secondary | ICD-10-CM

## 2021-02-17 ENCOUNTER — Other Ambulatory Visit: Payer: Self-pay | Admitting: Internal Medicine

## 2021-02-17 DIAGNOSIS — M2550 Pain in unspecified joint: Secondary | ICD-10-CM | POA: Diagnosis not present

## 2021-02-17 DIAGNOSIS — D500 Iron deficiency anemia secondary to blood loss (chronic): Secondary | ICD-10-CM | POA: Diagnosis not present

## 2021-02-17 DIAGNOSIS — Z0000 Encounter for general adult medical examination without abnormal findings: Secondary | ICD-10-CM | POA: Diagnosis not present

## 2021-02-17 DIAGNOSIS — Z124 Encounter for screening for malignant neoplasm of cervix: Secondary | ICD-10-CM | POA: Diagnosis not present

## 2021-02-17 DIAGNOSIS — Z1231 Encounter for screening mammogram for malignant neoplasm of breast: Secondary | ICD-10-CM

## 2021-02-17 DIAGNOSIS — M810 Age-related osteoporosis without current pathological fracture: Secondary | ICD-10-CM | POA: Diagnosis not present

## 2021-02-17 DIAGNOSIS — Z780 Asymptomatic menopausal state: Secondary | ICD-10-CM | POA: Diagnosis not present

## 2021-02-17 DIAGNOSIS — Z112 Encounter for screening for other bacterial diseases: Secondary | ICD-10-CM | POA: Diagnosis not present

## 2021-03-17 ENCOUNTER — Ambulatory Visit
Admission: RE | Admit: 2021-03-17 | Discharge: 2021-03-17 | Disposition: A | Discharge status: Home / Self Care | Payer: BC Managed Care – PPO | Source: Ambulatory Visit | Attending: Internal Medicine | Admitting: Internal Medicine

## 2021-03-17 ENCOUNTER — Other Ambulatory Visit: Payer: Self-pay

## 2021-03-17 DIAGNOSIS — Z1231 Encounter for screening mammogram for malignant neoplasm of breast: Secondary | ICD-10-CM

## 2021-03-17 DIAGNOSIS — Z780 Asymptomatic menopausal state: Secondary | ICD-10-CM | POA: Diagnosis not present

## 2021-03-17 DIAGNOSIS — M810 Age-related osteoporosis without current pathological fracture: Secondary | ICD-10-CM | POA: Diagnosis not present

## 2021-04-08 ENCOUNTER — Other Ambulatory Visit: Payer: Self-pay

## 2021-04-08 ENCOUNTER — Ambulatory Visit: Payer: BC Managed Care – PPO | Attending: Internal Medicine

## 2021-04-08 DIAGNOSIS — Z23 Encounter for immunization: Secondary | ICD-10-CM

## 2021-04-08 MED ORDER — PFIZER COVID-19 VAC BIVALENT 30 MCG/0.3ML IM SUSP
INTRAMUSCULAR | 0 refills | Status: AC
  Filled 2021-04-08: qty 0.3, 1d supply, fill #0

## 2021-04-08 NOTE — Progress Notes (Signed)
° °  Covid-19 Vaccination Clinic  Name:  Alicia Valencia    MRN: [id number] DOB: Jun 27, 1955  04/08/2021  Ms. Diloreto was observed post Covid-19 immunization for 15 minutes without incident. She was provided with Vaccine Information Sheet and instruction to access the V-Safe system.   Ms. Hobday was instructed to call 911 with any severe reactions post vaccine: Difficulty breathing  Swelling of face and throat  A fast heartbeat  A bad rash all over body  Dizziness and weakness   Immunizations Administered     Name Date Dose VIS Date Route   Pfizer Covid-19 Vaccine Bivalent Booster 04/08/2021 10:16 AM 0.3 mL 12/04/2020 Intramuscular   Manufacturer: Colon   Lot: [id number]   [place]: [place], PharmD, MBA Clinical Acute Care Pharmacist

## 2021-05-12 DIAGNOSIS — R030 Elevated blood-pressure reading, without diagnosis of hypertension: Secondary | ICD-10-CM | POA: Diagnosis not present

## 2021-05-12 DIAGNOSIS — Z780 Asymptomatic menopausal state: Secondary | ICD-10-CM | POA: Diagnosis not present

## 2021-05-12 DIAGNOSIS — M810 Age-related osteoporosis without current pathological fracture: Secondary | ICD-10-CM | POA: Diagnosis not present

## 2021-05-12 DIAGNOSIS — Z23 Encounter for immunization: Secondary | ICD-10-CM | POA: Diagnosis not present

## 2021-06-11 DIAGNOSIS — D2262 Melanocytic nevi of left upper limb, including shoulder: Secondary | ICD-10-CM | POA: Diagnosis not present

## 2021-06-11 DIAGNOSIS — L570 Actinic keratosis: Secondary | ICD-10-CM | POA: Diagnosis not present

## 2021-06-11 DIAGNOSIS — D2272 Melanocytic nevi of left lower limb, including hip: Secondary | ICD-10-CM | POA: Diagnosis not present

## 2021-06-11 DIAGNOSIS — Z85828 Personal history of other malignant neoplasm of skin: Secondary | ICD-10-CM | POA: Diagnosis not present

## 2021-06-11 DIAGNOSIS — X32XXXA Exposure to sunlight, initial encounter: Secondary | ICD-10-CM | POA: Diagnosis not present

## 2021-06-11 DIAGNOSIS — D2261 Melanocytic nevi of right upper limb, including shoulder: Secondary | ICD-10-CM | POA: Diagnosis not present

## 2021-06-13 DIAGNOSIS — H179 Unspecified corneal scar and opacity: Secondary | ICD-10-CM | POA: Diagnosis not present

## 2021-06-13 DIAGNOSIS — H52213 Irregular astigmatism, bilateral: Secondary | ICD-10-CM | POA: Diagnosis not present

## 2021-06-13 DIAGNOSIS — H2513 Age-related nuclear cataract, bilateral: Secondary | ICD-10-CM | POA: Diagnosis not present

## 2021-06-24 ENCOUNTER — Telehealth: Payer: Self-pay

## 2021-06-24 NOTE — Telephone Encounter (Signed)
CALLED PATIENT NO ANSWER LEFT VOICEMAIL FOR A CALL BACK ? ?

## 2021-06-25 ENCOUNTER — Telehealth: Payer: Self-pay

## 2021-06-25 NOTE — Telephone Encounter (Signed)
Patient state she is due after January 15 so I put in a recall letter for her  ?

## 2021-08-12 IMAGING — US US BREAST*L* LIMITED INC AXILLA
1 series · 1 of 1 positions shown · non-contrast
Comparison: Previous exam(s).

CLINICAL DATA: Patient presents for bilateral diagnostic
examination due to a palpable abnormality over the 9 o'clock
position of the left breast felt by her healthcare provider. Patient
does not feel a lump in this region. Patient is due for her annual
bilateral mammogram.

EXAM:
DIGITAL DIAGNOSTIC bilateral MAMMOGRAM WITH CAD AND TOMO
ULTRASOUND left BREAST

[Series 1: us breast*left* limited inc axilla · 0.06mm/px · 1 of 1 slices shown]
[im 1/1]
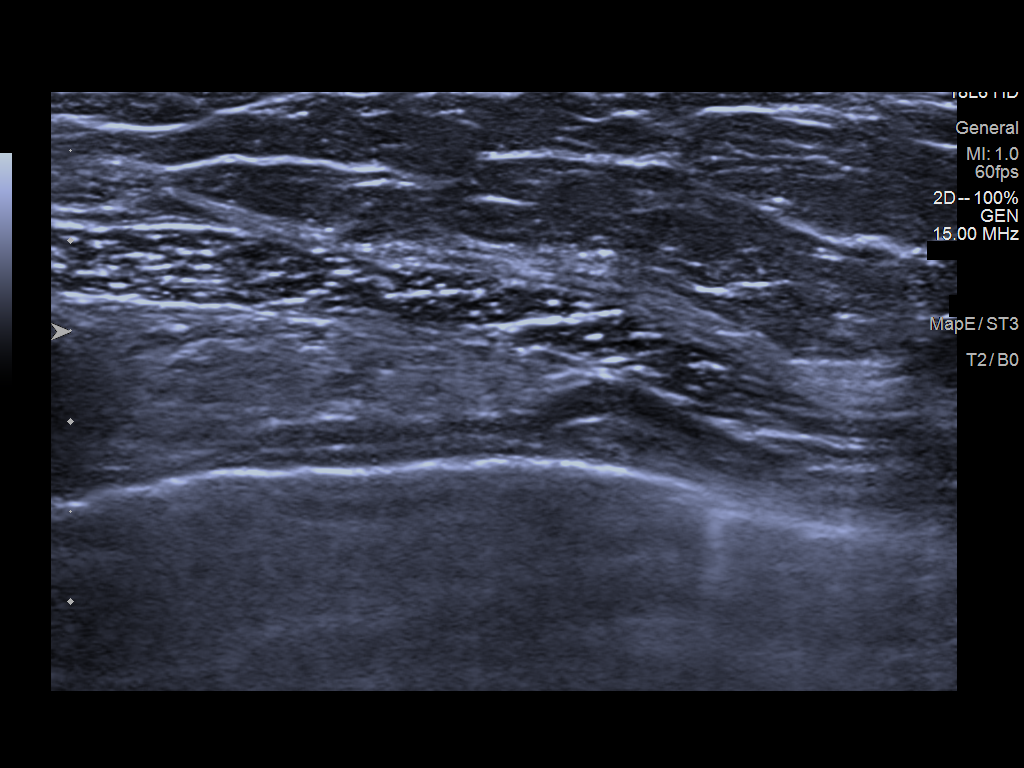

[1 of 1 positions shown; findings below may reference images not displayed]

ACR Breast Density Category c: The breast tissue is heterogeneously
dense, which may obscure small masses.
FINDINGS: Examination demonstrates no focal abnormality over the 9 o'clock
position of the left breast to account for patient's palpable
abnormality. Remainder of the left breast as well as the right
breast is unchanged.

Mammographic images were processed with CAD.

Targeted ultrasound is performed, showing no focal abnormality over
the 9 o'clock position of the left breast to account for patient's
palpable abnormality.
IMPRESSION: No focal abnormality over the 9 o'clock position of the left breast
to account for patient's palpable abnormality. Mammogram is
otherwise stable.

RECOMMENDATION:
Recommend continued management of patient's left breast palpable
abnormality on a clinical basis. Otherwise, recommend continued
annual bilateral screening mammographic follow-up.

I have discussed the findings and recommendations with the patient.
If applicable, a reminder letter will be sent to the patient
regarding the next appointment.

BI-RADS CATEGORY  1: Negative.

## 2021-11-11 ENCOUNTER — Telehealth: Payer: Self-pay

## 2021-11-11 ENCOUNTER — Other Ambulatory Visit: Payer: Self-pay

## 2021-11-11 DIAGNOSIS — Z1211 Encounter for screening for malignant neoplasm of colon: Secondary | ICD-10-CM

## 2021-11-11 NOTE — Telephone Encounter (Signed)
Gastroenterology Pre-Procedure Review  Request Date: 04/23/22 Requesting Physician: Dr. Allen Norris  PATIENT REVIEW QUESTIONS: The patient responded to the following health history questions as indicated:    1. Are you having any GI issues? no 2. Do you have a personal history of Polyps? no 3. Do you have a family history of Colon Cancer or Polyps? no 4. Diabetes Mellitus? no 5. Joint replacements in the past 12 months?no 6. Major health problems in the past 3 months?no 7. Any artificial heart valves, MVP, or defibrillator?no    MEDICATIONS & ALLERGIES:    Patient reports the following regarding taking any anticoagulation/antiplatelet therapy:   Plavix, Coumadin, Eliquis, Xarelto, Lovenox, Pradaxa, Brilinta, or Effient? no Aspirin? no  Patient confirms/reports the following medications:  Current Outpatient Medications  Medication Sig Dispense Refill   calcium citrate-vitamin D (CITRACAL+D) 315-200 MG-UNIT tablet Take by mouth.     COVID-19 mRNA bivalent vaccine, Pfizer, (PFIZER COVID-19 VAC BIVALENT) injection Inject into the muscle. 0.3 mL 0   Multiple Vitamins-Minerals (PRESERVISION AREDS 2 PO) Take 2 tablets by mouth daily.     Omega-3 Fatty Acids (FISH OIL PO) Take 1,500 mg by mouth daily.     pantoprazole (PROTONIX) 40 MG tablet Take 1 tablet (40 mg total) by mouth daily. 90 tablet 3   No current facility-administered medications for this visit.    Patient confirms/reports the following allergies:  No Known Allergies  No orders of the defined types were placed in this encounter.   AUTHORIZATION INFORMATION Primary Insurance: 1D#: Group #:  Secondary Insurance: 1D#: Group #:  SCHEDULE INFORMATION: Date: 04/23/22 Time: Location: [facility]

## 2022-01-07 DIAGNOSIS — M67431 Ganglion, right wrist: Secondary | ICD-10-CM | POA: Diagnosis not present

## 2022-01-07 DIAGNOSIS — D500 Iron deficiency anemia secondary to blood loss (chronic): Secondary | ICD-10-CM | POA: Diagnosis not present

## 2022-01-07 DIAGNOSIS — M810 Age-related osteoporosis without current pathological fracture: Secondary | ICD-10-CM | POA: Diagnosis not present

## 2022-02-04 ENCOUNTER — Other Ambulatory Visit: Payer: Self-pay | Admitting: Internal Medicine

## 2022-02-04 DIAGNOSIS — Z1231 Encounter for screening mammogram for malignant neoplasm of breast: Secondary | ICD-10-CM

## 2022-02-05 DIAGNOSIS — M1711 Unilateral primary osteoarthritis, right knee: Secondary | ICD-10-CM | POA: Diagnosis not present

## 2022-02-11 DIAGNOSIS — Z862 Personal history of diseases of the blood and blood-forming organs and certain disorders involving the immune mechanism: Secondary | ICD-10-CM | POA: Diagnosis not present

## 2022-02-11 DIAGNOSIS — Z0000 Encounter for general adult medical examination without abnormal findings: Secondary | ICD-10-CM | POA: Diagnosis not present

## 2022-02-11 DIAGNOSIS — Z780 Asymptomatic menopausal state: Secondary | ICD-10-CM | POA: Diagnosis not present

## 2022-02-11 DIAGNOSIS — Z131 Encounter for screening for diabetes mellitus: Secondary | ICD-10-CM | POA: Diagnosis not present

## 2022-02-11 DIAGNOSIS — Z13220 Encounter for screening for lipoid disorders: Secondary | ICD-10-CM | POA: Diagnosis not present

## 2022-02-18 DIAGNOSIS — Z0000 Encounter for general adult medical examination without abnormal findings: Secondary | ICD-10-CM | POA: Diagnosis not present

## 2022-02-18 DIAGNOSIS — M810 Age-related osteoporosis without current pathological fracture: Secondary | ICD-10-CM | POA: Diagnosis not present

## 2022-02-18 DIAGNOSIS — M67431 Ganglion, right wrist: Secondary | ICD-10-CM | POA: Diagnosis not present

## 2022-02-18 DIAGNOSIS — Z780 Asymptomatic menopausal state: Secondary | ICD-10-CM | POA: Diagnosis not present

## 2022-03-18 ENCOUNTER — Ambulatory Visit
Admission: RE | Admit: 2022-03-18 | Discharge: 2022-03-18 | Disposition: A | Discharge status: Home / Self Care | Payer: BC Managed Care – PPO | Source: Ambulatory Visit | Attending: Internal Medicine | Admitting: Internal Medicine

## 2022-03-18 DIAGNOSIS — Z1231 Encounter for screening mammogram for malignant neoplasm of breast: Secondary | ICD-10-CM | POA: Diagnosis not present

## 2022-03-18 DIAGNOSIS — D225 Melanocytic nevi of trunk: Secondary | ICD-10-CM | POA: Diagnosis not present

## 2022-03-18 DIAGNOSIS — D044 Carcinoma in situ of skin of scalp and neck: Secondary | ICD-10-CM | POA: Diagnosis not present

## 2022-03-18 DIAGNOSIS — D2261 Melanocytic nevi of right upper limb, including shoulder: Secondary | ICD-10-CM | POA: Diagnosis not present

## 2022-03-18 DIAGNOSIS — D485 Neoplasm of uncertain behavior of skin: Secondary | ICD-10-CM | POA: Diagnosis not present

## 2022-03-18 DIAGNOSIS — D2272 Melanocytic nevi of left lower limb, including hip: Secondary | ICD-10-CM | POA: Diagnosis not present

## 2022-03-18 DIAGNOSIS — D0462 Carcinoma in situ of skin of left upper limb, including shoulder: Secondary | ICD-10-CM | POA: Diagnosis not present

## 2022-03-18 DIAGNOSIS — Z85828 Personal history of other malignant neoplasm of skin: Secondary | ICD-10-CM | POA: Diagnosis not present

## 2022-03-18 DIAGNOSIS — L570 Actinic keratosis: Secondary | ICD-10-CM | POA: Diagnosis not present

## 2022-03-20 DIAGNOSIS — R0989 Other specified symptoms and signs involving the circulatory and respiratory systems: Secondary | ICD-10-CM | POA: Diagnosis not present

## 2022-03-25 DIAGNOSIS — M67431 Ganglion, right wrist: Secondary | ICD-10-CM | POA: Diagnosis not present

## 2022-04-15 ENCOUNTER — Telehealth: Payer: Self-pay | Admitting: *Deleted

## 2022-04-15 MED ORDER — NA SULFATE-K SULFATE-MG SULF 17.5-3.13-1.6 GM/177ML PO SOLN: 1.0000 | Freq: Once | ORAL | 0 refills | Status: AC

## 2022-04-15 NOTE — Telephone Encounter (Signed)
Patient called back. I have sent Suprep to [facility] for patient. We went over the directions to take the [place] and went through questions that she had.

## 2022-04-15 NOTE — Telephone Encounter (Signed)
Patient left voicemail stating that according to her instruction sheets, she is supposed to call office so that Sharyn Lull can send the Suprep to the pharmacy.  I have return patient's call and left her voicemail to return my call.

## 2022-04-22 ENCOUNTER — Encounter: Payer: Self-pay | Admitting: Gastroenterology

## 2022-04-23 ENCOUNTER — Ambulatory Visit: Payer: BC Managed Care – PPO | Admitting: Anesthesiology

## 2022-04-23 ENCOUNTER — Encounter
Admission: RE | Disposition: A | Discharge status: Home / Self Care | Payer: Self-pay | Source: Home / Self Care | Attending: Gastroenterology

## 2022-04-23 ENCOUNTER — Ambulatory Visit
Admission: RE | Admit: 2022-04-23 | Discharge: 2022-04-23 | Disposition: A | Discharge status: Home / Self Care | Payer: BC Managed Care – PPO | Attending: Gastroenterology | Admitting: Gastroenterology

## 2022-04-23 DIAGNOSIS — Z85828 Personal history of other malignant neoplasm of skin: Secondary | ICD-10-CM | POA: Insufficient documentation

## 2022-04-23 DIAGNOSIS — Z1211 Encounter for screening for malignant neoplasm of colon: Secondary | ICD-10-CM | POA: Diagnosis present

## 2022-04-23 DIAGNOSIS — Z87891 Personal history of nicotine dependence: Secondary | ICD-10-CM | POA: Diagnosis not present

## 2022-04-23 HISTORY — PX: COLONOSCOPY WITH PROPOFOL: SHX5780

## 2022-04-23 SURGERY — COLONOSCOPY WITH PROPOFOL
Anesthesia: General

## 2022-04-23 MED ORDER — SODIUM CHLORIDE 0.9 % IV SOLN: INTRAVENOUS | Status: DC

## 2022-04-23 MED ORDER — PROPOFOL 1000 MG/100ML IV EMUL
INTRAVENOUS | Status: AC
  Filled 2022-04-23: qty 100

## 2022-04-23 MED ORDER — PROPOFOL 500 MG/50ML IV EMUL
INTRAVENOUS | Status: DC | PRN
  Administered 2022-04-23: 120 ug/kg/min via INTRAVENOUS

## 2022-04-23 MED ORDER — PROPOFOL 10 MG/ML IV BOLUS
INTRAVENOUS | Status: DC | PRN
  Administered 2022-04-23: 100 mg via INTRAVENOUS

## 2022-04-23 MED ORDER — LIDOCAINE HCL (PF) 2 % IJ SOLN
INTRAMUSCULAR | Status: AC
  Filled 2022-04-23: qty 10

## 2022-04-23 MED ORDER — DEXMEDETOMIDINE HCL IN NACL 200 MCG/50ML IV SOLN
INTRAVENOUS | Status: DC | PRN
  Administered 2022-04-23: 4 ug via INTRAVENOUS
  Administered 2022-04-23: 8 ug via INTRAVENOUS

## 2022-04-23 MED ORDER — LIDOCAINE HCL (CARDIAC) PF 100 MG/5ML IV SOSY
PREFILLED_SYRINGE | INTRAVENOUS | Status: DC | PRN
  Administered 2022-04-23: 50 mg via INTRAVENOUS

## 2022-04-23 NOTE — Transfer of Care (Signed)
Immediate Anesthesia Transfer of Care Note  Patient: Alicia Valencia  Procedure(s) Performed: COLONOSCOPY WITH PROPOFOL  Patient Location: PACU and Endoscopy Unit  Anesthesia Type:General  Level of Consciousness: drowsy and patient cooperative  Airway & Oxygen Therapy: Patient Spontanous Breathing  Post-op Assessment: Report given to RN and Post -op Vital signs reviewed and stable  Post vital signs: Reviewed and stable  Last Vitals:  Vitals Value Taken Time  BP 107/72 04/23/22 0820  Temp 35.9 C 04/23/22 0819  Pulse 69 04/23/22 0821  Resp 14 04/23/22 0821  SpO2 98 % 04/23/22 0821  Vitals shown include unvalidated device data.  Last Pain:  Vitals:   04/23/22 0819  TempSrc: Temporal  PainSc: Asleep         Complications: No notable events documented.

## 2022-04-23 NOTE — Anesthesia Preprocedure Evaluation (Signed)
Anesthesia Evaluation  Patient identified by MRN, date of birth, ID band Patient awake    Reviewed: Allergy & Precautions, NPO status , Patient's Chart, lab work & pertinent test results  Airway Mallampati: II  TM Distance: >3 FB Neck ROM: full    Dental  (+) Teeth Intact   Pulmonary neg pulmonary ROS, Patient abstained from smoking., former smoker   Pulmonary exam normal breath sounds clear to auscultation       Cardiovascular Exercise Tolerance: Good negative cardio ROS Normal cardiovascular exam Rhythm:Regular Rate:Normal     Neuro/Psych negative neurological ROS  negative psych ROS   GI/Hepatic negative GI ROS, Neg liver ROS,GERD  ,,  Endo/Other  negative endocrine ROS    Renal/GU negative Renal ROS  negative genitourinary   Musculoskeletal   Abdominal Normal abdominal exam  (+)   Peds negative pediatric ROS (+)  Hematology negative hematology ROS (+) Blood dyscrasia, anemia   Anesthesia Other Findings Past Medical History: No date: Arthritis No date: Basal cell carcinoma No date: Chicken pox No date: History of anemia No date: History of fainting spells of unknown cause No date: PONV (postoperative nausea and vomiting)  Past Surgical History: 09/11/2020: 24 HOUR PH STUDY; N/A     Comment:  Procedure: 24 HOUR [place];  Surgeon: Mauri Pole, MD;  Location: [facility];  Service: Endoscopy;                Laterality: N/A;  with Impedance No date: BREAST BIOPSY; Left     Comment:  negative core No date: CESAREAN SECTION 04/2012: COLONOSCOPY     Comment:  normal - repeat 10 years - done in [place], [place] 09/11/2020: ESOPHAGEAL MANOMETRY; N/A     Comment:  Procedure: ESOPHAGEAL MANOMETRY (EM);  Surgeon:               Mauri Pole, MD;  Location: [facility];                Service: Endoscopy;  Laterality: N/A; 08/08/2020: ESOPHAGOGASTRODUODENOSCOPY (EGD) WITH PROPOFOL; N/A      Comment:  Procedure: ESOPHAGOGASTRODUODENOSCOPY (EGD) WITH               PROPOFOL;  Surgeon: Lucilla Lame, MD;  Location: [facility];  Service: Endoscopy;  Laterality: N/A; No date: THYROIDECTOMY     Reproductive/Obstetrics negative OB ROS                             Anesthesia Physical Anesthesia Plan  ASA: 2  Anesthesia Plan: General   Post-op Pain Management:    Induction: Intravenous  PONV Risk Score and Plan: Propofol infusion and TIVA  Airway Management Planned: Natural Airway  Additional Equipment:   Intra-op Plan:   Post-operative Plan:   Informed Consent: I have reviewed the patients History and Physical, chart, labs and discussed the procedure including the risks, benefits and alternatives for the proposed anesthesia with the patient or authorized representative who has indicated his/her understanding and acceptance.     Dental Advisory Given  Plan Discussed with: CRNA and Surgeon  Anesthesia Plan Comments:        Anesthesia Quick Evaluation

## 2022-04-23 NOTE — H&P (Signed)
Lucilla Lame, MD [address] [place], [place] [address] Phone: [phone] Fax : [phone]  Primary Care Physician:  Gladstone Lighter, MD Primary Gastroenterologist:  Dr. Allen Norris  Pre-Procedure History & Physical: HPI:  Alicia Valencia is a 67 y.o. female is here for a screening colonoscopy.   Past Medical History:  Diagnosis Date   Arthritis    Basal cell carcinoma    Chicken pox    History of anemia    History of fainting spells of unknown cause    PONV (postoperative nausea and vomiting)     Past Surgical History:  Procedure Laterality Date   24 HOUR [place] STUDY N/A 09/11/2020   Procedure: [place] STUDY;  Surgeon: Mauri Pole, MD;  Location: [facility];  Service: Endoscopy;  Laterality: N/A;  with Impedance   BREAST BIOPSY Left    negative core   CESAREAN SECTION     COLONOSCOPY  04/2012   normal - repeat 10 years - done in [place], [place] N/A 09/11/2020   Procedure: ESOPHAGEAL MANOMETRY (EM);  Surgeon: Mauri Pole, MD;  Location: [facility];  Service: Endoscopy;  Laterality: N/A;   ESOPHAGOGASTRODUODENOSCOPY (EGD) WITH PROPOFOL N/A 08/08/2020   Procedure: ESOPHAGOGASTRODUODENOSCOPY (EGD) WITH PROPOFOL;  Surgeon: Lucilla Lame, MD;  Location: [facility];  Service: Endoscopy;  Laterality: N/A;   THYROIDECTOMY      Prior to Admission medications   Medication Sig Start Date End Date Taking? Authorizing Provider  calcium citrate-vitamin D (CITRACAL+D) 315-200 MG-UNIT tablet Take by mouth.    [provider]  COVID-19 mRNA bivalent vaccine, Pfizer, (PFIZER COVID-19 VAC BIVALENT) injection Inject into the muscle. 04/08/21   Carlyle Basques, MD  Multiple Vitamins-Minerals (PRESERVISION AREDS 2 PO) Take 2 tablets by mouth daily.    [provider]  Omega-3 Fatty Acids (FISH OIL PO) Take 1,500 mg by mouth daily.    [provider]  pantoprazole (PROTONIX) 40 MG tablet Take 1 tablet (40 mg  total) by mouth daily. 07/16/20   Lucilla Lame, MD    Allergies as of 11/11/2021   (No Known Allergies)    Family History  Problem Relation Age of Onset   Heart disease Mother    Hypertension Mother    Diabetes Mother    Diabetes Father    Hearing loss Father    Hypertension Sister    Diabetes Maternal Aunt    Diabetes Maternal Uncle    Diabetes Maternal Grandmother    Diabetes Maternal Grandfather    Breast cancer Neg Hx     Social History   Socioeconomic History   Marital status: Married    Spouse name: Not on file   Number of children: Not on file   Years of education: Not on file   Highest education level: Not on file  Occupational History   Not on file  Tobacco Use   Smoking status: Former   Smokeless tobacco: Never  Vaping Use   Vaping Use: Never used  Substance and Sexual Activity   Alcohol use: Yes   Drug use: No   Sexual activity: Yes  Other Topics Concern   Not on file  Social History Narrative   Retired from Scientist, research (life sciences) estate. Married lives w husband- 2 grown kids out of state -[employer]. For fun-garden and walks.    Social Determinants of Health   Financial Resource Strain: Not on file  Food Insecurity: Not on file  Transportation Needs: Not on file  Physical Activity: Not on file  Stress: Not on file  Social Connections: Not on file  Intimate Partner Violence: Not on file    Review of Systems: See HPI, otherwise negative ROS  Physical Exam: BP (!) 138/90   Pulse 65   Temp (!) 96.9 F (36.1 C) (Temporal)   Resp 18   Ht '5\' 3"'$  (1.6 m)   Wt 59.9 kg   SpO2 100%   BMI 23.38 kg/m  General:   Alert,  pleasant and cooperative in NAD Head:  Normocephalic and atraumatic. Neck:  Supple; no masses or thyromegaly. Lungs:  Clear throughout to auscultation.    Heart:  Regular rate and rhythm. Abdomen:  Soft, nontender and nondistended. Normal bowel sounds, without guarding, and without rebound.   Neurologic:  Alert and  oriented x4;  grossly normal  neurologically.  Impression/Plan: Alicia Valencia is now here to undergo a screening colonoscopy.  Risks, benefits, and alternatives regarding colonoscopy have been reviewed with the patient.  Questions have been answered.  All parties agreeable.

## 2022-04-23 NOTE — Op Note (Signed)
[facility] Gastroenterology Patient Name: Alicia Valencia Procedure Date: 04/23/2022 7:08 AM MRN: [id number] Account #: [id number] Date of Birth: 1955-12-24 Admit Type: Outpatient Age: 67 Room: [facility] ENDO ROOM 4 Gender: Female Note Status: Finalized Instrument Name: Park Meo [id number] Procedure:             Colonoscopy Indications:           Screening for colorectal malignant neoplasm Providers:             Lucilla Lame MD, MD Referring MD:          Gladstone Lighter, MD (Referring MD) Medicines:             Propofol per Anesthesia Complications:         No immediate complications. Procedure:             Pre-Anesthesia Assessment:                        - Prior to the procedure, a History and Physical was                         performed, and patient medications and allergies were                         reviewed. The patient's tolerance of previous                         anesthesia was also reviewed. The risks and benefits                         of the procedure and the sedation options and risks                         were discussed with the patient. All questions were                         answered, and informed consent was obtained. Prior                         Anticoagulants: The patient has taken no anticoagulant                         or antiplatelet agents. ASA Grade Assessment: II - A                         patient with mild systemic disease. After reviewing                         the risks and benefits, the patient was deemed in                         satisfactory condition to undergo the procedure.                        After obtaining informed consent, the colonoscope was                         passed under direct vision. Throughout the procedure,  the patient's blood pressure, pulse, and oxygen                         saturations were monitored continuously. The                         Colonoscope was introduced  through the anus and                         advanced to the the cecum, identified by appendiceal                         orifice and ileocecal valve. The colonoscopy was                         performed without difficulty. The patient tolerated                         the procedure well. The quality of the bowel                         preparation was excellent. Findings:      The perianal and digital rectal examinations were normal.      The colon (entire examined portion) appeared normal. Impression:            - The entire examined colon is normal.                        - No specimens collected. Recommendation:        - Discharge patient to home.                        - Resume previous diet.                        - Continue present medications. Procedure Code(s):     --- Professional ---                        [phone], Colonoscopy, flexible; diagnostic, including                         collection of specimen(s) by brushing or washing, when                         performed (separate procedure) Diagnosis Code(s):     --- Professional ---                        Z12.11, Encounter for screening for malignant neoplasm                         of colon CPT copyright 2022 American Medical Association. All rights reserved. The codes documented in this report are preliminary and upon coder review may  be revised to meet current compliance requirements. Lucilla Lame MD, MD 04/23/2022 8:19:08 AM This report has been signed electronically. Number of Addenda: 0 Note Initiated On: 04/23/2022 7:08 AM Scope Withdrawal Time: 0 hours 7 minutes 31 seconds  Total Procedure Duration: 0 hours 13 minutes 59 seconds  Estimated Blood Loss:  Estimated blood loss:  none.      [facility]

## 2022-04-23 NOTE — Anesthesia Postprocedure Evaluation (Signed)
Anesthesia Post Note  Patient: Alicia Valencia  Procedure(s) Performed: COLONOSCOPY WITH PROPOFOL  Patient location during evaluation: PACU Anesthesia Type: General Level of consciousness: awake and awake and alert Pain management: pain level controlled Vital Signs Assessment: post-procedure vital signs reviewed and stable Respiratory status: nonlabored ventilation Cardiovascular status: stable Anesthetic complications: no  No notable events documented.   Last Vitals:  Vitals:   04/23/22 0831 04/23/22 0840  BP: 114/80 110/74  Pulse: 72 (!) 58  Resp: 16 14  Temp:    SpO2: 98% 99%    Last Pain:  Vitals:   04/23/22 0840  TempSrc:   PainSc: 0-No pain                 VAN STAVEREN,Afra Tricarico

## 2022-04-24 ENCOUNTER — Encounter: Payer: Self-pay | Admitting: Gastroenterology

## 2022-08-14 IMAGING — MG MM DIGITAL SCREENING BILAT W/ TOMO AND CAD
8 series · 9 of 24 positions shown · non-contrast
Comparison: Previous exam(s).

CLINICAL DATA: Screening.

EXAM:
DIGITAL SCREENING BILATERAL MAMMOGRAM WITH TOMOSYNTHESIS AND CAD
TECHNIQUE: Bilateral screening digital craniocaudal and mediolateral oblique
mammograms were obtained. Bilateral screening digital breast
tomosynthesis was performed. The images were evaluated with
computer-aided detection.

[L MLO synth-2D]
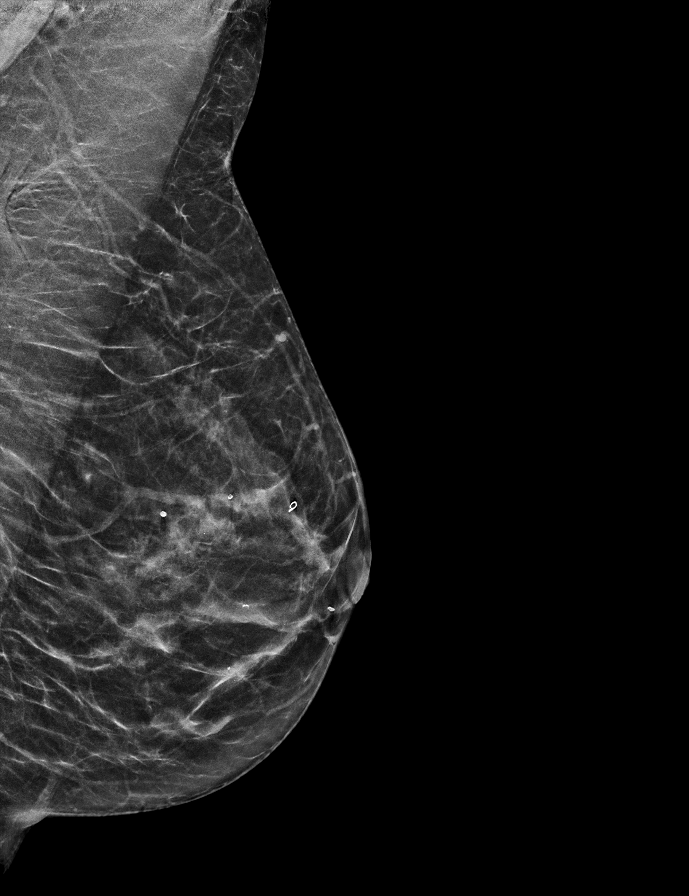

[L CC synth-2D]
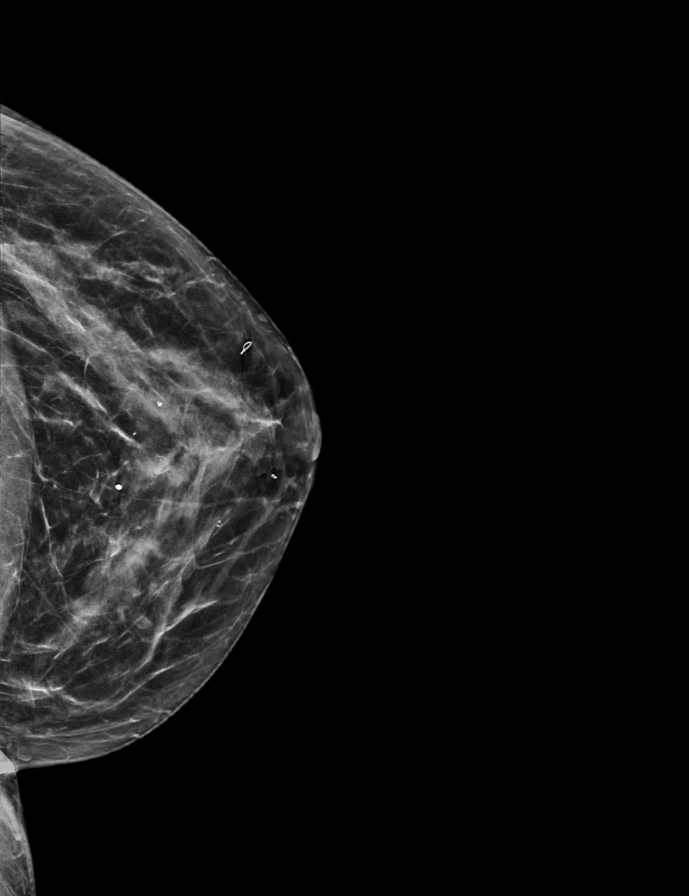

[R MLO synth-2D]
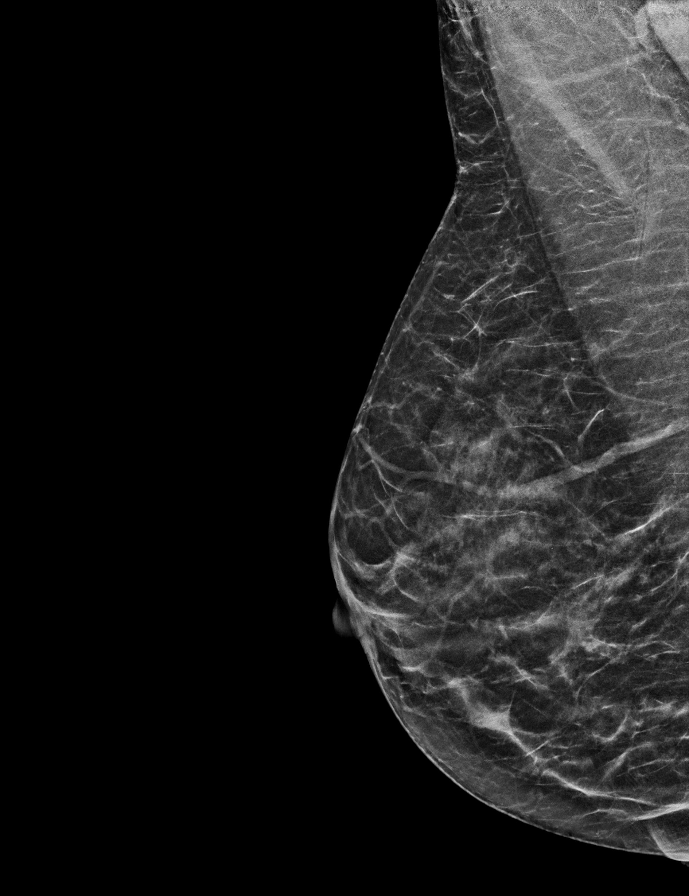

[R CC synth-2D]
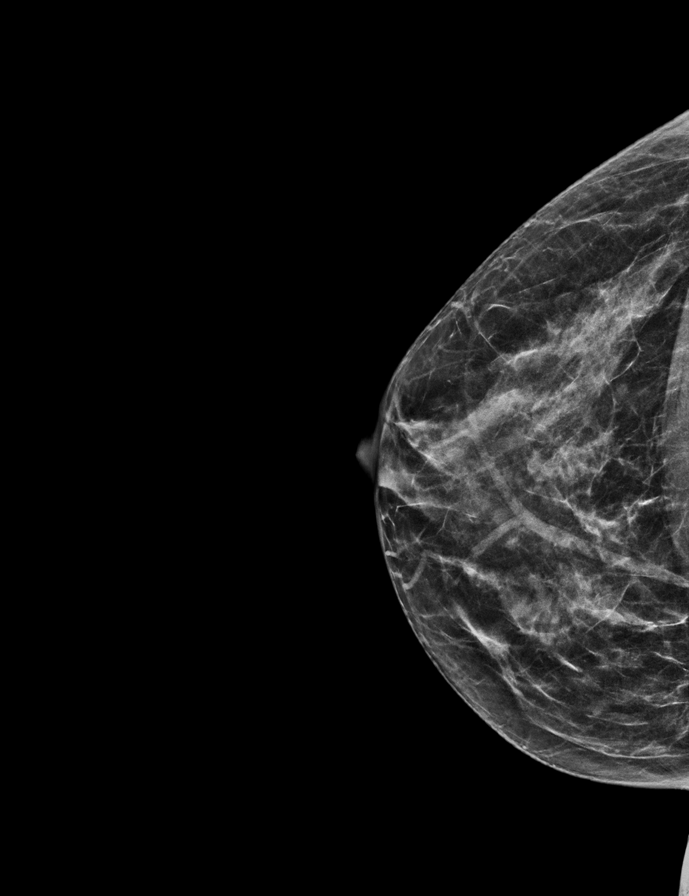

[R CC tomo · 2 of 45 frames shown]
[frame 15/45]
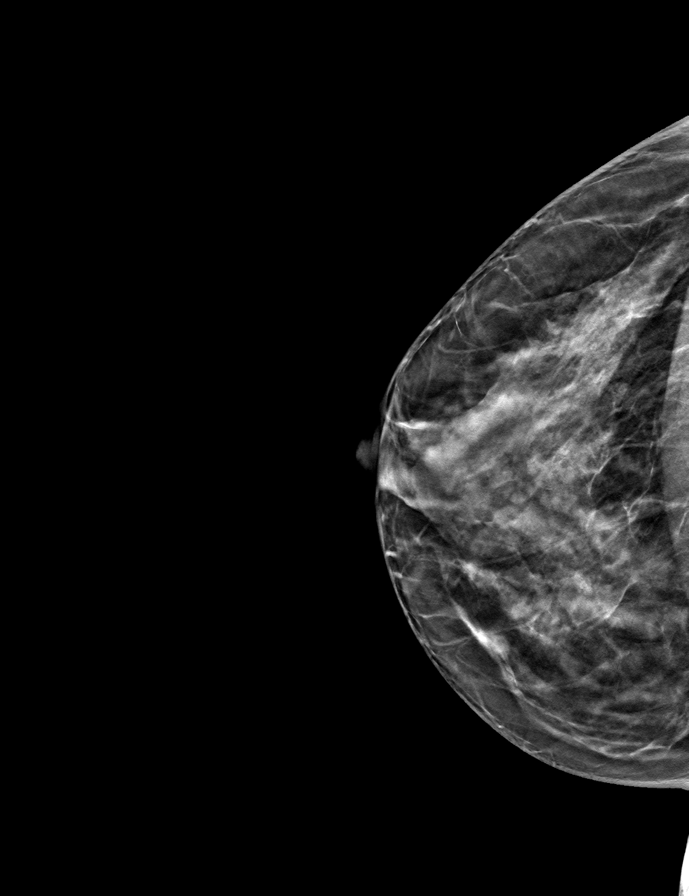
[frame 23/45]
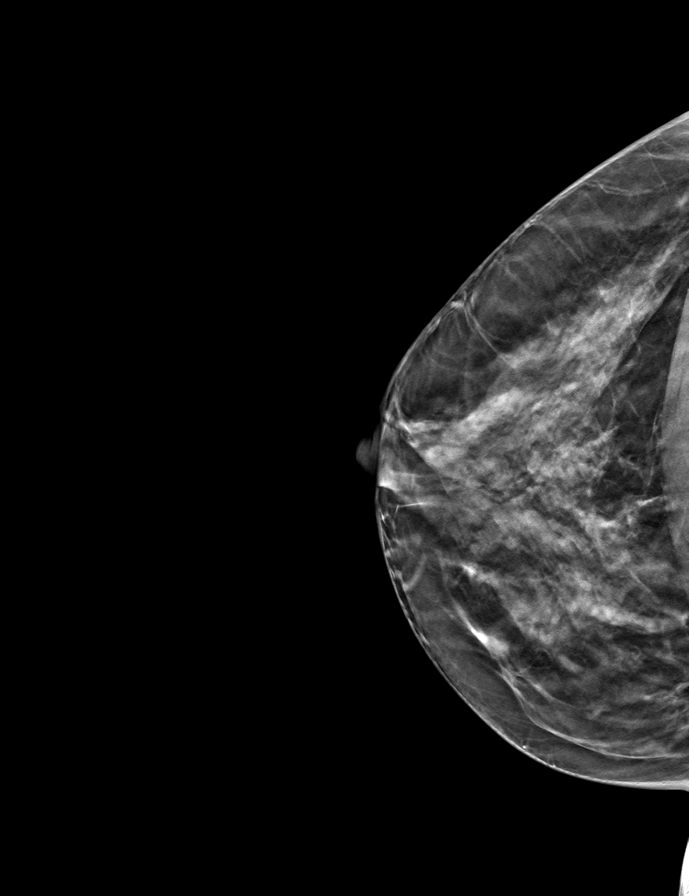

[L CC tomo · tomo slice 26/51.0]
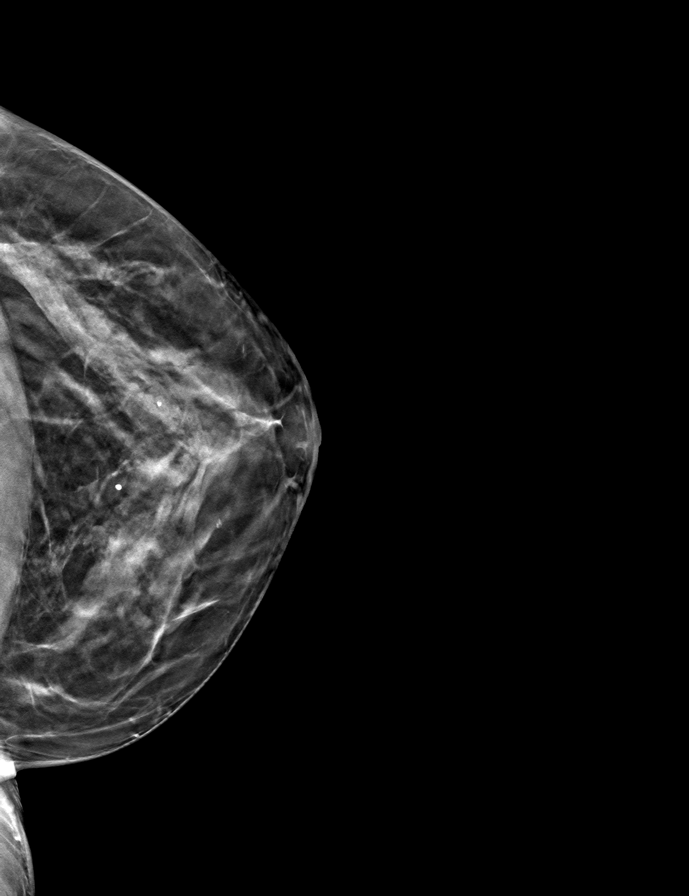

[R MLO tomo · tomo slice 23/44.0]
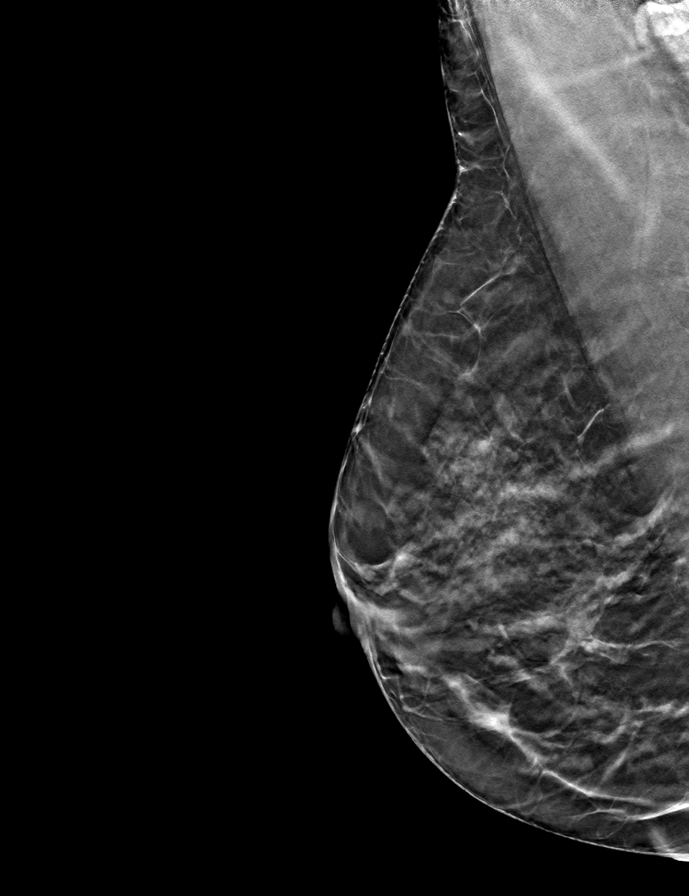

[L MLO tomo · tomo slice 23/45.0]
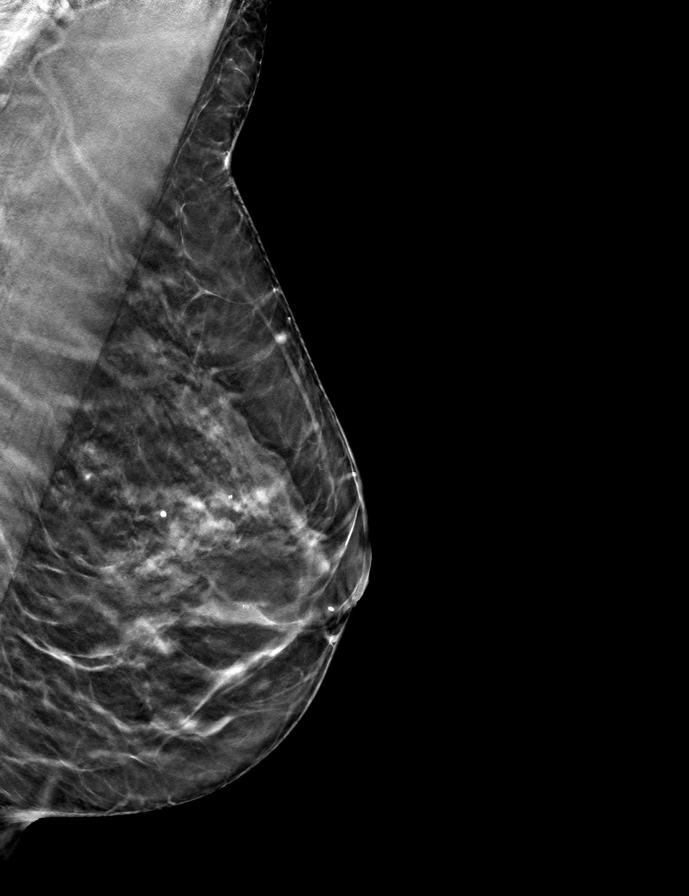

[9 of 24 positions shown; findings below may reference images not displayed]

ACR Breast Density Category c: The breast tissue is heterogeneously
dense, which may obscure small masses.
FINDINGS: There are no findings suspicious for malignancy.
IMPRESSION: No mammographic evidence of malignancy. A result letter of this
screening mammogram will be mailed directly to the patient.

RECOMMENDATION:
Screening mammogram in one year. (Code:Q3-W-BC3)

BI-RADS CATEGORY  1: Negative.

## 2023-02-23 ENCOUNTER — Other Ambulatory Visit: Payer: Self-pay | Admitting: Internal Medicine

## 2023-02-23 DIAGNOSIS — Z1231 Encounter for screening mammogram for malignant neoplasm of breast: Secondary | ICD-10-CM

## 2023-02-24 ENCOUNTER — Other Ambulatory Visit: Payer: Self-pay | Admitting: Internal Medicine

## 2023-02-24 DIAGNOSIS — M810 Age-related osteoporosis without current pathological fracture: Secondary | ICD-10-CM

## 2023-02-24 DIAGNOSIS — Z0000 Encounter for general adult medical examination without abnormal findings: Secondary | ICD-10-CM

## 2023-03-23 ENCOUNTER — Ambulatory Visit
Admission: RE | Admit: 2023-03-23 | Discharge: 2023-03-23 | Disposition: A | Discharge status: Home / Self Care | Payer: BC Managed Care – PPO | Source: Ambulatory Visit | Attending: Internal Medicine | Admitting: Internal Medicine

## 2023-03-23 DIAGNOSIS — M810 Age-related osteoporosis without current pathological fracture: Secondary | ICD-10-CM | POA: Insufficient documentation

## 2023-03-23 DIAGNOSIS — Z1231 Encounter for screening mammogram for malignant neoplasm of breast: Secondary | ICD-10-CM | POA: Diagnosis present

## 2023-03-23 DIAGNOSIS — Z0000 Encounter for general adult medical examination without abnormal findings: Secondary | ICD-10-CM | POA: Diagnosis present

## 2024-02-18 ENCOUNTER — Other Ambulatory Visit: Payer: Self-pay | Admitting: Internal Medicine

## 2024-02-18 DIAGNOSIS — Z1231 Encounter for screening mammogram for malignant neoplasm of breast: Secondary | ICD-10-CM

## 2024-03-20 ENCOUNTER — Other Ambulatory Visit: Payer: Self-pay | Admitting: Medical Genetics

## 2024-03-27 ENCOUNTER — Inpatient Hospital Stay: Admission: RE | Admit: 2024-03-27 | Discharge: 2024-03-27 | Attending: Medical Genetics | Admitting: Medical Genetics

## 2024-03-27 ENCOUNTER — Ambulatory Visit
Admission: RE | Admit: 2024-03-27 | Discharge: 2024-03-27 | Disposition: A | Discharge status: Home / Self Care | Source: Ambulatory Visit | Attending: Internal Medicine | Admitting: Internal Medicine

## 2024-03-27 DIAGNOSIS — Z1231 Encounter for screening mammogram for malignant neoplasm of breast: Secondary | ICD-10-CM | POA: Insufficient documentation

## 2024-04-14 LAB — GENECONNECT MOLECULAR SCREEN: Genetic Analysis Overall Interpretation: NEGATIVE
